# Patient Record
Sex: Female | Born: 1995 | Race: White | Hispanic: No | Marital: Single | Smoking: Never smoker
Health system: Southern US, Community
[De-identification: ages and names within clinical notes are randomized; demographics above are authoritative.]

## PROBLEM LIST (undated history)

## (undated) DIAGNOSIS — F4310 Post-traumatic stress disorder, unspecified: Secondary | ICD-10-CM

## (undated) DIAGNOSIS — F329 Major depressive disorder, single episode, unspecified: Secondary | ICD-10-CM

## (undated) DIAGNOSIS — K469 Unspecified abdominal hernia without obstruction or gangrene: Secondary | ICD-10-CM

## (undated) DIAGNOSIS — F32A Depression, unspecified: Secondary | ICD-10-CM

## (undated) DIAGNOSIS — E282 Polycystic ovarian syndrome: Secondary | ICD-10-CM

## (undated) DIAGNOSIS — F419 Anxiety disorder, unspecified: Secondary | ICD-10-CM

---
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
## 2019-06-08 ENCOUNTER — Ambulatory Visit (INDEPENDENT_AMBULATORY_CARE_PROVIDER_SITE_OTHER): Payer: BC Managed Care – PPO

## 2019-06-08 ENCOUNTER — Other Ambulatory Visit: Payer: Self-pay

## 2019-06-08 ENCOUNTER — Encounter: Payer: Self-pay | Admitting: Emergency Medicine

## 2019-06-08 ENCOUNTER — Ambulatory Visit
Admission: EM | Admit: 2019-06-08 | Discharge: 2019-06-08 | Disposition: A | Discharge status: Home / Self Care | Payer: BC Managed Care – PPO

## 2019-06-08 ENCOUNTER — Ambulatory Visit: Payer: BC Managed Care – PPO

## 2019-06-08 DIAGNOSIS — M25572 Pain in left ankle and joints of left foot: Secondary | ICD-10-CM

## 2019-06-08 DIAGNOSIS — M79672 Pain in left foot: Secondary | ICD-10-CM

## 2019-06-08 HISTORY — DX: Polycystic ovarian syndrome: E28.2

## 2019-06-08 HISTORY — DX: Depression, unspecified: F32.A

## 2019-06-08 HISTORY — DX: Post-traumatic stress disorder, unspecified: F43.10

## 2019-06-08 HISTORY — DX: Anxiety disorder, unspecified: F41.9

## 2019-06-08 NOTE — ED Triage Notes (Signed)
Pt sts left ankle pain after trip and fall today twisting her ankle

## 2019-06-08 NOTE — Discharge Instructions (Signed)
Xray negative for fracture or dislocation. Ibuprofen 800mg  three times a day or naproxen 440mg  twice a day for pain. Ice compress, elevation, ace wrap during activity.  This may take a few weeks to completely resolve, but should be feeling better each week.  Follow-up with orthopedics for further evaluation if symptoms not improving.

## 2019-06-08 NOTE — ED Provider Notes (Signed)
[facility]    CSN: [id number] Arrival date & time: 06/08/19  1739      History   Chief Complaint Chief Complaint  Patient presents with  . Ankle Pain    HPI Kathy Velasquez is a 24 y.o. female.   24 year old female comes in for left ankle/foot pain after injury earlier today.  She inverted her ankle when stepping off an edge.  Was able to ambulate on own after incident, though with pain.  Since then, has had increased pain and swelling, and therefore came in for evaluation.  Pain at rest mostly to the medial and lateral aspect of the ankle.  Decreased range of motion due to pain.     Past Medical History:  Diagnosis Date  . Anxiety   . Depression   . PCOS (polycystic ovarian syndrome)   . PTSD (post-traumatic stress disorder)     There are no problems to display for this patient.   History reviewed. No pertinent surgical history.  OB History   No obstetric history on file.      Home Medications    Prior to Admission medications   Medication Sig Start Date End Date Taking? Authorizing Provider  DULoxetine (CYMBALTA) 30 MG capsule Take 60 mg by mouth daily.   Yes [provider]  lamoTRIgine (LAMICTAL) 25 MG tablet Take 25 mg by mouth 2 (two) times daily.   Yes [provider]  metFORMIN (GLUCOPHAGE) 500 MG tablet Take 500 mg by mouth daily with breakfast.   Yes [provider]  spironolactone (ALDACTONE) 50 MG tablet Take 100 mg by mouth daily.   Yes [provider]    Family History Family History  Problem Relation Age of Onset  . Cancer Mother     Social History Social History   Tobacco Use  . Smoking status: Current Every Day Smoker    Types: E-cigarettes  . Smokeless tobacco: Never Used  Substance Use Topics  . Alcohol use: Not Currently  . Drug use: Never     Allergies   Patient has no known allergies.   Review of Systems Review of Systems  Reason unable to perform ROS: See HPI as above.      Physical Exam Triage Vital Signs ED Triage Vitals  Enc Vitals Group     BP 06/08/19 1753 135/87     Pulse Rate 06/08/19 1753 (!) 108     Resp 06/08/19 1753 18     Temp 06/08/19 1753 97.8 F (36.6 C)     Temp Source 06/08/19 1753 Oral     SpO2 06/08/19 1753 97 %     Weight --      Height --      Head Circumference --      Peak Flow --      Pain Score 06/08/19 1752 6     Pain Loc --      Pain Edu? --      Excl. in [place]? --    No data found.  Updated Vital Signs BP 135/87 (BP Location: Right Arm)   Pulse (!) 108   Temp 97.8 F (36.6 C) (Oral)   Resp 18   SpO2 97%   Physical Exam Constitutional:      General: She is not in acute distress.    Appearance: Normal appearance. She is well-developed. She is not toxic-appearing or diaphoretic.  HENT:     Head: Normocephalic and atraumatic.  Eyes:     Conjunctiva/sclera: Conjunctivae normal.  Pupils: Pupils are equal, round, and reactive to light.  Pulmonary:     Effort: Pulmonary effort is normal. No respiratory distress.     Comments: Speaking in full sentences without difficulty Musculoskeletal:     Cervical back: Normal range of motion and neck supple.     Comments: Swelling to the lateral malleolus.  No contusion.  No tenderness to palpation of proximal tib-fib.  No tenderness to palpation of bilateral malleolus.  Tenderness to palpation of dorsal aspect of proximal medial foot/proximal 1st-2nd MTP. Decreased flexion, internal rotation. NVI  Skin:    General: Skin is warm and dry.  Neurological:     Mental Status: She is alert and oriented to person, place, and time.      UC Treatments / Results  Labs (all labs ordered are listed, but only abnormal results are displayed) Labs Reviewed - No data to display  EKG   Radiology DG Foot Complete Left  Result Date: 06/08/2019 CLINICAL DATA:  Left ankle and foot pain.  Fall. EXAM: LEFT FOOT - COMPLETE 3+ VIEW COMPARISON:  None. FINDINGS: There is no evidence  of fracture or dislocation. There is no evidence of arthropathy or other focal bone abnormality. Soft tissues are unremarkable. IMPRESSION: Negative. Electronically Signed   By: Charlett Nose M.D.   On: 06/08/2019 19:03    Procedures Procedures (including critical care time)  Medications Ordered in UC Medications - No data to display  Initial Impression / Assessment and Plan / UC Course  I have reviewed the triage vital signs and the nursing notes.  Pertinent labs & imaging results that were available during my care of the patient were reviewed by me and considered in my medical decision making (see chart for details).    X-ray reviewed by me without obvious fracture/dislocation.  NSAIDs, ice compress, elevation, Ace wrap during activity.  Expected course of healing discussed.  Return precautions given.  Patient expresses understanding and agrees to plan.  Final Clinical Impressions(s) / UC Diagnoses   Final diagnoses:  Acute left ankle pain   ED Prescriptions    None     PDMP not reviewed this encounter.   Belinda Fisher, PA-C 06/08/19 1941

## 2019-11-18 ENCOUNTER — Encounter (HOSPITAL_COMMUNITY): Payer: Self-pay

## 2019-11-18 ENCOUNTER — Other Ambulatory Visit: Payer: Self-pay

## 2019-11-18 ENCOUNTER — Emergency Department (HOSPITAL_COMMUNITY)
Admission: EM | Admit: 2019-11-18 | Discharge: 2019-11-18 | Disposition: A | Discharge status: Home / Self Care | Payer: BC Managed Care – PPO | Attending: Emergency Medicine | Admitting: Emergency Medicine

## 2019-11-18 ENCOUNTER — Emergency Department (HOSPITAL_COMMUNITY): Payer: BC Managed Care – PPO

## 2019-11-18 DIAGNOSIS — R0789 Other chest pain: Secondary | ICD-10-CM | POA: Insufficient documentation

## 2019-11-18 DIAGNOSIS — F17200 Nicotine dependence, unspecified, uncomplicated: Secondary | ICD-10-CM | POA: Insufficient documentation

## 2019-11-18 LAB — I-STAT BETA HCG BLOOD, ED (MC, WL, AP ONLY): I-stat hCG, quantitative: <5 m[IU]/mL (ref <5)

## 2019-11-18 LAB — BASIC METABOLIC PANEL
Anion gap: 10 (ref 5–15)
BUN: 17 mg/dL (ref 6–20)
CO2: 22 mmol/L (ref 22–32)
Calcium: 8.8 mg/dL — ABNORMAL LOW (ref 8.9–10.3)
Chloride: 103 mmol/L (ref 98–111)
Creatinine, Ser: 0.77 mg/dL (ref 0.44–1.00)
GFR, Estimated: >60 mL/min (ref >60)
Glucose, Bld: 92 mg/dL (ref 70–99)
Potassium: 3.7 mmol/L (ref 3.5–5.1)
Sodium: 135 mmol/L (ref 135–145)

## 2019-11-18 LAB — CBC
HCT: 40.7 % (ref 36.0–46.0)
Hemoglobin: 13.5 g/dL (ref 12.0–15.0)
MCH: 31 pg (ref 26.0–34.0)
MCHC: 33.2 g/dL (ref 30.0–36.0)
MCV: 93.6 fL (ref 80.0–100.0)
Platelets: 279 10*3/uL (ref 150–400)
RBC: 4.35 MIL/uL (ref 3.87–5.11)
RDW: 12.9 % (ref 11.5–15.5)
WBC: 8.8 10*3/uL (ref 4.0–10.5)
nRBC: 0 % (ref 0.0–0.2)

## 2019-11-18 LAB — TROPONIN I (HIGH SENSITIVITY): Troponin I (High Sensitivity): 3 ng/L (ref <18)

## 2019-11-18 NOTE — Discharge Instructions (Addendum)
You have been evaluated for your chest discomfort.  Fortunately your chest x-ray, EKG, and labs are reassuring.  No signs of life-threatening emergency.  Follow-up closely with your primary care doctor for further care.

## 2019-11-18 NOTE — ED Notes (Signed)
Patient transported to X-ray 

## 2019-11-18 NOTE — ED Provider Notes (Signed)
[facility] EMERGENCY DEPARTMENT Provider Note   CSN: [id number] Arrival date & time: 11/18/19  4166     History Chief Complaint  Patient presents with  . Chest Pain    Kathy Velasquez is a 24 y.o. female.  The history is provided by the patient and medical records. No language interpreter was used.  Chest Pain    24 year old female significant history of anxiety, depression, PCOS, presenting for evaluation of chest pain. Patient states she experienced an pinching sensation to her left shoulder/axillary region this morning that spreads towards her upper chest. At that moment she felt a bit dizzy and was concerned. She reached out and in recall an on-call nurse who said patient should come to the ED. She mentions she has been experiencing this pinching sensation in the same region for several years. She did also mention that she has been evaluated in the ED as well as a PCP but states no one takes it seriously. She denies any prior injury to the affected area, she denies any fever, chills, cough, exertional chest pain, or rash. She has had ultrasound to assess for potential tumor and it was negative. She has not expressing any lumps or bumps in the affected area. She denies any specific treatment tried. At this time she denies having any significant pain. No prior history of PE or DVT.  Past Medical History:  Diagnosis Date  . Anxiety   . Depression   . PCOS (polycystic ovarian syndrome)   . PTSD (post-traumatic stress disorder)     There are no problems to display for this patient.   History reviewed. No pertinent surgical history.   OB History   No obstetric history on file.     Family History  Problem Relation Age of Onset  . Cancer Mother     Social History   Tobacco Use  . Smoking status: Current Every Day Smoker  . Smokeless tobacco: Current User  Vaping Use  . Vaping Use: Every day  Substance Use Topics  . Alcohol use: Yes  . Drug use:  Never    Home Medications Prior to Admission medications   Not on File    Allergies    Patient has no known allergies.  Review of Systems   Review of Systems  Cardiovascular: Positive for chest pain.  All other systems reviewed and are negative.   Physical Exam Updated Vital Signs BP 121/82   Pulse 86   Temp 98.9 F (37.2 C)   Resp 16   Ht 5\' 5"  (1.651 m)   Wt 106.6 kg   LMP  (LMP Unknown)   SpO2 99%   BMI 39.11 kg/m   Physical Exam Vitals and nursing note reviewed.  Constitutional:      General: She is not in acute distress.    Appearance: She is well-developed. She is obese.  HENT:     Head: Atraumatic.  Eyes:     Conjunctiva/sclera: Conjunctivae normal.  Cardiovascular:     Rate and Rhythm: Normal rate and regular rhythm.     Heart sounds: Normal heart sounds.  Pulmonary:     Effort: Pulmonary effort is normal.     Breath sounds: Normal breath sounds. No wheezing, rhonchi or rales.     Comments: Examinations of the left chest wall, left axillary region and left shoulder are unremarkable. No overlying skin changes, no lymphadenopathy, no nodules or deformity noted. Left shoulder with full range of motion. Radial pulse 2+. Chest:  Chest wall: No tenderness.  Abdominal:     Palpations: Abdomen is soft.     Tenderness: There is no abdominal tenderness.  Musculoskeletal:     Cervical back: Neck supple.  Skin:    Findings: No rash.  Neurological:     Mental Status: She is alert.     ED Results / Procedures / Treatments   Labs (all labs ordered are listed, but only abnormal results are displayed) Labs Reviewed  BASIC METABOLIC PANEL - Abnormal; Notable for the following components:      Result Value   Calcium 8.8 (*)    All other components within normal limits  CBC  I-STAT BETA HCG BLOOD, ED ([facility], [facility], [facility] ONLY)  TROPONIN I (HIGH SENSITIVITY)    EKG None   Date: 11/18/2019  Rate: 87  Rhythm: normal sinus rhythm  QRS Axis: normal  Intervals:  normal  ST/T Wave abnormalities: normal  Conduction Disutrbances: none  Narrative Interpretation:   Old EKG Reviewed: No significant changes noted EKG reviewed and interpreted by me    Radiology DG Chest 2 View  Result Date: 11/18/2019 CLINICAL DATA:  Chest pain EXAM: CHEST - 2 VIEW COMPARISON:  None. FINDINGS: Heart size and mediastinal contours are within normal limits. Lungs are clear. No pleural effusion or pneumothorax is seen. Osseous structures about the chest are unremarkable. IMPRESSION: Normal chest x-ray. Electronically Signed   By: Bary Richard M.D.   On: 11/18/2019 07:15    Procedures Procedures (including critical care time)  Medications Ordered in ED Medications - No data to display  ED Course  I have reviewed the triage vital signs and the nursing notes.  Pertinent labs & imaging results that were available during my care of the patient were reviewed by me and considered in my medical decision making (see chart for details).    MDM Rules/Calculators/A&P                          BP 121/82   Pulse 86   Temp 98.9 F (37.2 C)   Resp 16   Ht 5\' 5"  (1.651 m)   Wt 106.6 kg   LMP  (LMP Unknown)   SpO2 99%   BMI 39.11 kg/m   Final Clinical Impression(s) / ED Diagnoses Final diagnoses:  Atypical chest pain    Rx / DC Orders ED Discharge Orders    None     7:20 AM Patient report recurrent pinching sensation to her left shoulder/axillary region for several years worse this morning. States she has been seen evaluate multiple times for this complaint but no definitive diagnosis was given.No obvious signs of skin infection, She does not have any significant risk factor for ACS or PE. No signs of trauma. Examination is essentially unremarkable.  Her EKG is normal, pregnancy test is negative, normal WBC, normal H&H, normal chest x-ray, normal troponin.  Heart score of 1, low risk of Mace.  Patient is stable for discharge.  Outpatient follow-up recommended.     , PA-C 11/18/19 11/20/19    [id number], MD 11/18/19 [phone]

## 2019-11-18 NOTE — ED Triage Notes (Signed)
Patient came in via POV with c/o central chest pain that feels like "pinching"; Pt states she has hx of same but the "pitching" feeling has spread; Pt states no pain on arrival but has gotten to a 4/10 and has caused her to be concerned; pt c/o some SOB with dizziness and symptoms started a few hours ago. pt is a&ox 4 and has no acute distress; pt ambulated to treatment room from the waiting room;-Monique,RN

## 2019-12-05 ENCOUNTER — Ambulatory Visit
Admission: EM | Admit: 2019-12-05 | Discharge: 2019-12-05 | Disposition: A | Discharge status: Home / Self Care | Payer: Self-pay | Attending: Physician Assistant | Admitting: Physician Assistant

## 2019-12-05 ENCOUNTER — Other Ambulatory Visit: Payer: Self-pay

## 2019-12-05 DIAGNOSIS — R0789 Other chest pain: Secondary | ICD-10-CM

## 2019-12-05 MED ORDER — HYDROXYZINE HCL 25 MG PO TABS
25.0000 mg | ORAL_TABLET | Freq: Four times a day (QID) | ORAL | 0 refills | Status: DC | PRN
Start: 2019-12-05 — End: 2020-07-15

## 2019-12-05 NOTE — ED Triage Notes (Signed)
Pt stated she was having dizziness, lightheadedness, and also left side chest pain radiating down her left arm. The pain seems to be intermittent as well as move periodically. Pt also has a diagnoses of anxiety. Pt had to stop her duloxetine and lamotrigine suddenly due to a loss of benefits and has been unable to treat her anxiety issues. Pt is aox4 and ambulatory.

## 2019-12-05 NOTE — Discharge Instructions (Signed)
No alarming signs on exam.  You can trial hydroxyzine for anxiety if needed.  Follow-up with PCP for further evaluation.

## 2019-12-05 NOTE — ED Provider Notes (Signed)
[facility]    CSN: [id number] Arrival date & time: 12/05/19  1901      History   Chief Complaint Chief Complaint  Patient presents with  . Chest Pain    left side and under ribcage on left    HPI Kathy Velasquez is a 24 y.o. female.   24 year old female comes in for left sided chest pain radiating to left arm. States has had "pinching" to the left chest causing pain that is intermittent for the past few years. However, recently, this pain can now radiate down the left arm, and causing pressure to the head with dizziness/lightheadedness. Patient went to the ED 2 weeks ago for the same with negative workup. She denies any aggravating or alleviating factor. Denies URI symptoms, fever. Denies syncope. She has perceived left sided weakness along with the pain. Denies slurred speech, facial drooping. She states she has history of anxiety, and unsure if this is causing the new symptoms. She had stopped duloxetine and lamotrigine few months ago due to loss of insurance. Denies HI/SI.     Past Medical History:  Diagnosis Date  . Anxiety   . Depression   . PCOS (polycystic ovarian syndrome)   . PTSD (post-traumatic stress disorder)     There are no problems to display for this patient.   History reviewed. No pertinent surgical history.  OB History   No obstetric history on file.      Home Medications    Prior to Admission medications   Medication Sig Start Date End Date Taking? Authorizing Provider  hydrOXYzine (ATARAX/VISTARIL) 25 MG tablet Take 1 tablet (25 mg total) by mouth every 6 (six) hours as needed for anxiety. 12/05/19   Belinda Fisher, PA-C    Family History Family History  Problem Relation Age of Onset  . Cancer Mother     Social History Social History   Tobacco Use  . Smoking status: Current Every Day Smoker  . Smokeless tobacco: Current User  Vaping Use  . Vaping Use: Every day  Substance Use Topics  . Alcohol use: Yes  . Drug use: Never      Allergies   Patient has no known allergies.   Review of Systems Review of Systems  Reason unable to perform ROS: See HPI as above.     Physical Exam Triage Vital Signs ED Triage Vitals  Enc Vitals Group     BP 12/05/19 1916 122/84     Pulse Rate 12/05/19 1916 88     Resp 12/05/19 1916 20     Temp 12/05/19 1916 97.7 F (36.5 C)     Temp Source 12/05/19 1916 Oral     SpO2 12/05/19 1916 97 %     Weight --      Height --      Head Circumference --      Peak Flow --      Pain Score 12/05/19 1945 4     Pain Loc --      Pain Edu? --      Excl. in [place]? --    No data found.  Updated Vital Signs BP 122/84 (BP Location: Left Arm)   Pulse 88   Temp 97.7 F (36.5 C) (Oral)   Resp 20   LMP  (LMP Unknown)   SpO2 97%   Physical Exam Constitutional:      General: She is not in acute distress.    Appearance: Normal appearance. She is not ill-appearing, toxic-appearing  or diaphoretic.  HENT:     Head: Normocephalic and atraumatic.     Mouth/Throat:     Mouth: Mucous membranes are moist.     Pharynx: Oropharynx is clear. Uvula midline.  Eyes:     Extraocular Movements: Extraocular movements intact.     Conjunctiva/sclera: Conjunctivae normal.     Pupils: Pupils are equal, round, and reactive to light.  Cardiovascular:     Rate and Rhythm: Normal rate and regular rhythm.     Heart sounds: Normal heart sounds. No murmur heard.  No friction rub. No gallop.   Pulmonary:     Effort: Pulmonary effort is normal. No accessory muscle usage, prolonged expiration, respiratory distress or retractions.     Comments: Lungs clear to auscultation without adventitious lung sounds. Chest:     Chest wall: No tenderness.  Musculoskeletal:     Cervical back: Normal range of motion and neck supple.     Comments: Full ROM of BUE/BLE. Strength/sensation 5/5.  Skin:    General: Skin is warm and dry.  Neurological:     General: No focal deficit present.     Mental Status: She is alert  and oriented to person, place, and time.     Comments: Cranial nerves II-XII grossly intact. Strength 5/5 bilaterally for upper and lower extremity. Sensation intact. Normal coordination with normal finger to nose, heel to shin. Negative pronator drift, romberg. Gait intact. Able to ambulate on own without difficulty.       UC Treatments / Results  Labs (all labs ordered are listed, but only abnormal results are displayed) Labs Reviewed - No data to display  EKG   Radiology No results found.  Procedures Procedures (including critical care time)  Medications Ordered in UC Medications - No data to display  Initial Impression / Assessment and Plan / UC Course  I have reviewed the triage vital signs and the nursing notes.  Pertinent labs & imaging results that were available during my care of the patient were reviewed by me and considered in my medical decision making (see chart for details).    Patient with workup at the ED with negative trop, CXR. Patient with perceived left arm weakness, on exam strength 5/5 BUE/BLE. Neurology exam grossly intact without focal deficits. Will trial hydroxyzine for anxiety and have patient follow up with PCP for further evaluation of atypical chest pain. Resources provided. Return precautions given.  Final Clinical Impressions(s) / UC Diagnoses   Final diagnoses:  Atypical chest pain    ED Prescriptions    Medication Sig Dispense Auth. Provider   hydrOXYzine (ATARAX/VISTARIL) 25 MG tablet Take 1 tablet (25 mg total) by mouth every 6 (six) hours as needed for anxiety. 12 tablet Belinda Fisher, PA-C     PDMP not reviewed this encounter.   Belinda Fisher, PA-C 12/10/19 [phone]

## 2020-04-10 ENCOUNTER — Other Ambulatory Visit: Payer: Self-pay

## 2020-04-10 ENCOUNTER — Encounter (HOSPITAL_COMMUNITY): Payer: Self-pay | Admitting: Emergency Medicine

## 2020-04-10 ENCOUNTER — Ambulatory Visit (HOSPITAL_COMMUNITY)
Admission: EM | Admit: 2020-04-10 | Discharge: 2020-04-10 | Disposition: A | Discharge status: Home / Self Care | Payer: Commercial Managed Care - PPO | Attending: Student | Admitting: Student

## 2020-04-10 DIAGNOSIS — S29012A Strain of muscle and tendon of back wall of thorax, initial encounter: Secondary | ICD-10-CM | POA: Diagnosis not present

## 2020-04-10 MED ORDER — TIZANIDINE HCL 2 MG PO CAPS: 2.0000 mg | ORAL_CAPSULE | Freq: Three times a day (TID) | ORAL | 0 refills | Status: DC

## 2020-04-10 MED ORDER — PREDNISONE 20 MG PO TABS
40.0000 mg | ORAL_TABLET | Freq: Every day | ORAL | 0 refills | Status: AC
Start: 2020-04-10 — End: 2020-04-15

## 2020-04-10 MED ORDER — PREDNISONE 20 MG PO TABS
40.0000 mg | ORAL_TABLET | Freq: Every day | ORAL | 0 refills | Status: DC
Start: 2020-04-10 — End: 2020-04-10

## 2020-04-10 MED ORDER — TIZANIDINE HCL 2 MG PO CAPS
2.0000 mg | ORAL_CAPSULE | Freq: Three times a day (TID) | ORAL | 0 refills | Status: DC
Start: 2020-04-10 — End: 2020-07-12

## 2020-04-10 NOTE — Discharge Instructions (Addendum)
-  Start the new muscle relaxer-Zanaflex (tizanidine).  You can take this up to 3 times daily for muscular strain and pain.  This can make you drowsy, so do not take before driving or operating machinery.  Try to avoid taking this within 24 hours of driving. -I also sent a steroid-prednisone (Deltasone), 2 pills taken together in the morning.  This can give you energy, so take in the morning. -You can also do ibuprofen and Tylenol.  You can take ibuprofen up to 800mg  3 times daily with food, and Tylenol 1000 mg 3 times daily. -Try heating pad, warm baths, range of motion exercises. -Seek additional medical attention if you develop new symptoms like numbness in your legs, changes in bowel or bladder function, chest pain, shortness of breath.

## 2020-04-10 NOTE — ED Triage Notes (Signed)
Pt states that she is having back pain that started Sunday morning. Pt states that it is very difficult to move even to lift her hands/ arms up without a pain shooting through her back . Pt states the pain in centralized in the middle of her back.

## 2020-04-10 NOTE — ED Provider Notes (Signed)
[facility]    CSN: [id number] Arrival date & time: 04/10/20  1436      History   Chief Complaint Chief Complaint  Patient presents with  . Back Pain    HPI Kathy Velasquez is a 25 y.o. female presenting with back pain x4 days.  History anxiety, depression, PCOS, PTSD.  Notes centralized back pain for 4 days.  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  History of some low back issues over the years, but this time she does not have any muscle relaxers on hand.  Did take 2 tabs of Flexeril that she had at home with improvement in back pain, but she ran out of this yesterday.  Notes improvement with heating pad as well. Denies urinary symptoms. Denies pain shooting down legs, denies numbness in arms/legs, denies weakness in arms/legs, denies saddle anesthesia, denies bowel/bladder incontinence.    HPI  Past Medical History:  Diagnosis Date  . Anxiety   . Depression   . PCOS (polycystic ovarian syndrome)   . PTSD (post-traumatic stress disorder)     There are no problems to display for this patient.   History reviewed. No pertinent surgical history.  OB History   No obstetric history on file.      Home Medications    Prior to Admission medications   Medication Sig Start Date End Date Taking? Authorizing Provider  predniSONE (DELTASONE) 20 MG tablet Take 2 tablets (40 mg total) by mouth daily for 5 days. 04/10/20 04/15/20 Yes Rhys Martini, PA-C  tizanidine (ZANAFLEX) 2 MG capsule Take 1 capsule (2 mg total) by mouth 3 (three) times daily. 04/10/20  Yes Rhys Martini, PA-C  hydrOXYzine (ATARAX/VISTARIL) 25 MG tablet Take 1 tablet (25 mg total) by mouth every 6 (six) hours as needed for anxiety. 12/05/19   Belinda Fisher, PA-C    Family History Family History  Problem Relation Age of Onset  . Cancer Mother     Social History Social History   Tobacco Use  . Smoking status: Current Every Day Smoker  . Smokeless  tobacco: Current User  Vaping Use  . Vaping Use: Every day  Substance Use Topics  . Alcohol use: Yes  . Drug use: Never     Allergies   Patient has no known allergies.   Review of Systems Review of Systems  Constitutional: Negative for chills, fever and unexpected weight change.  Respiratory: Negative for chest tightness and shortness of breath.   Cardiovascular: Negative for chest pain and palpitations.  Gastrointestinal: Negative for abdominal pain, diarrhea, nausea and vomiting.  Genitourinary: Negative for decreased urine volume, difficulty urinating and frequency.  Musculoskeletal: Positive for back pain. Negative for arthralgias, gait problem, joint swelling, myalgias, neck pain and neck stiffness.  Skin: Negative for wound.  Neurological: Negative for dizziness, tremors, seizures, syncope, facial asymmetry, speech difficulty, weakness, light-headedness, numbness and headaches.  All other systems reviewed and are negative.    Physical Exam Triage Vital Signs ED Triage Vitals  Enc Vitals Group     BP 04/10/20 1531 111/86     Pulse Rate 04/10/20 1531 80     Resp 04/10/20 1531 18     Temp 04/10/20 1531 97.9 F (36.6 C)     Temp Source 04/10/20 1531 Temporal     SpO2 04/10/20 1531 97 %     Weight --      Height --  Head Circumference --      Peak Flow --      Pain Score 04/10/20 1530 5     Pain Loc --      Pain Edu? --      Excl. in GC? --    No data found.  Updated Vital Signs BP 111/86 (BP Location: Right Arm)   Pulse 80   Temp 97.9 F (36.6 C) (Temporal)   Resp 18   SpO2 97%   Visual Acuity Right Eye Distance:   Left Eye Distance:   Bilateral Distance:    Right Eye Near:   Left Eye Near:    Bilateral Near:     Physical Exam Vitals reviewed.  Constitutional:      General: She is not in acute distress.    Appearance: Normal appearance. She is not ill-appearing.  HENT:     Head: Normocephalic and atraumatic.  Eyes:     Extraocular  Movements: Extraocular movements intact.     Pupils: Pupils are equal, round, and reactive to light.  Cardiovascular:     Rate and Rhythm: Normal rate and regular rhythm.     Heart sounds: Normal heart sounds.  Pulmonary:     Effort: Pulmonary effort is normal.     Breath sounds: Normal breath sounds and air entry.  Abdominal:     Tenderness: There is no abdominal tenderness. There is no right CVA tenderness, left CVA tenderness, guarding or rebound.     Comments: No bowel or bladder incontinence.  Musculoskeletal:     Cervical back: Normal range of motion. No swelling, deformity, signs of trauma, rigidity, spasms, tenderness, bony tenderness or crepitus. No pain with movement.     Thoracic back: Spasms and tenderness present. No swelling, deformity, signs of trauma or bony tenderness. Normal range of motion. No scoliosis.     Lumbar back: No swelling, deformity, signs of trauma, spasms, tenderness or bony tenderness. Normal range of motion. Negative right straight leg raise test and negative left straight leg raise test. No scoliosis.     Comments: Strength 5/5 in UEs and LEs. Bilateral thoracic paraspinous muscle tenderness to deep palpation, left worse than right.  Pain elicited with flexion of the thoracic spine. No spinous deformity or tenderness, no step-off.  Neurological:     General: No focal deficit present.     Mental Status: She is alert and oriented to person, place, and time.     Cranial Nerves: No cranial nerve deficit.     Comments: Strength 5/5 in UEs and LEs. Gait normal. Sensation intact in UEs and LEs.   Psychiatric:        Mood and Affect: Mood normal.        Behavior: Behavior normal.        Thought Content: Thought content normal.        Judgment: Judgment normal.      UC Treatments / Results  Labs (all labs ordered are listed, but only abnormal results are displayed) Labs Reviewed - No data to display  EKG   Radiology No results  found.  Procedures Procedures (including critical care time)  Medications Ordered in UC Medications - No data to display  Initial Impression / Assessment and Plan / UC Course  I have reviewed the triage vital signs and the nursing notes.  Pertinent labs & imaging results that were available during my care of the patient were reviewed by me and considered in my medical decision making (see chart for details).  This patient is a 25 year old female presenting with thoracic strain.  No red flag symptoms.  Zanaflex and prednisone as below.  She is not a diabetic.  Also rec Tylenol/ibuprofen.  Red flag symptoms discussed return precautions discussed.  This chart was dictated using voice recognition software, Dragon. Despite the best efforts of this provider to proofread and correct errors, errors may still occur which can change documentation meaning.   Final Clinical Impressions(s) / UC Diagnoses   Final diagnoses:  Strain of thoracic back region     Discharge Instructions     -Start the new muscle relaxer-Zanaflex (tizanidine).  You can take this up to 3 times daily for muscular strain and pain.  This can make you drowsy, so do not take before driving or operating machinery.  Try to avoid taking this within 24 hours of driving. -I also sent a steroid-prednisone (Deltasone), 2 pills taken together in the morning.  This can give you energy, so take in the morning. -You can also do ibuprofen and Tylenol.  You can take ibuprofen up to 800mg  3 times daily with food, and Tylenol 1000 mg 3 times daily. -Try heating pad, warm baths, range of motion exercises. -Seek additional medical attention if you develop new symptoms like numbness in your legs, changes in bowel or bladder function, chest pain, shortness of breath.    ED Prescriptions    Medication Sig Dispense Auth. Provider   tizanidine (ZANAFLEX) 2 MG capsule  (Status: Discontinued) Take 1 capsule (2 mg total) by mouth 3  (three) times daily. 21 capsule , PA-C   predniSONE (DELTASONE) 20 MG tablet  (Status: Discontinued) Take 2 tablets (40 mg total) by mouth daily for 5 days. 10 tablet Rhys Martini, PA-C   tizanidine (ZANAFLEX) 2 MG capsule Take 1 capsule (2 mg total) by mouth 3 (three) times daily. 21 capsule Rhys Martini, PA-C   predniSONE (DELTASONE) 20 MG tablet Take 2 tablets (40 mg total) by mouth daily for 5 days. 10 tablet Rhys Martini, PA-C     PDMP not reviewed this encounter.   Rhys Martini, PA-C 04/10/20 1709

## 2020-05-16 ENCOUNTER — Encounter (HOSPITAL_COMMUNITY): Payer: Self-pay | Admitting: *Deleted

## 2020-05-16 ENCOUNTER — Other Ambulatory Visit: Payer: Self-pay

## 2020-05-16 ENCOUNTER — Emergency Department (HOSPITAL_COMMUNITY): Payer: Commercial Managed Care - PPO

## 2020-05-16 ENCOUNTER — Emergency Department (HOSPITAL_COMMUNITY)
Admission: EM | Admit: 2020-05-16 | Discharge: 2020-05-16 | Disposition: A | Discharge status: Home / Self Care | Payer: Commercial Managed Care - PPO | Attending: Emergency Medicine | Admitting: Emergency Medicine

## 2020-05-16 DIAGNOSIS — R1033 Periumbilical pain: Secondary | ICD-10-CM

## 2020-05-16 DIAGNOSIS — R630 Anorexia: Secondary | ICD-10-CM | POA: Insufficient documentation

## 2020-05-16 DIAGNOSIS — K429 Umbilical hernia without obstruction or gangrene: Secondary | ICD-10-CM | POA: Insufficient documentation

## 2020-05-16 DIAGNOSIS — R030 Elevated blood-pressure reading, without diagnosis of hypertension: Secondary | ICD-10-CM | POA: Diagnosis not present

## 2020-05-16 HISTORY — DX: Unspecified abdominal hernia without obstruction or gangrene: K46.9

## 2020-05-16 LAB — URINALYSIS, ROUTINE W REFLEX MICROSCOPIC
Bilirubin Urine: NEGATIVE
Glucose, UA: NEGATIVE mg/dL
Hgb urine dipstick: NEGATIVE
Ketones, ur: NEGATIVE mg/dL
Leukocytes,Ua: NEGATIVE
Nitrite: NEGATIVE
Protein, ur: NEGATIVE mg/dL
Specific Gravity, Urine: 1.018 (ref 1.005–1.030)
pH: 5 (ref 5.0–8.0)

## 2020-05-16 LAB — CBC
HCT: 41.8 % (ref 36.0–46.0)
Hemoglobin: 14.2 g/dL (ref 12.0–15.0)
MCH: 31.3 pg (ref 26.0–34.0)
MCHC: 34 g/dL (ref 30.0–36.0)
MCV: 92.3 fL (ref 80.0–100.0)
Platelets: 280 10*3/uL (ref 150–400)
RBC: 4.53 MIL/uL (ref 3.87–5.11)
RDW: 13.2 % (ref 11.5–15.5)
WBC: 8.7 10*3/uL (ref 4.0–10.5)
nRBC: 0 % (ref 0.0–0.2)

## 2020-05-16 LAB — COMPREHENSIVE METABOLIC PANEL
ALT: 44 U/L (ref 0–44)
AST: 20 U/L (ref 15–41)
Albumin: 4.1 g/dL (ref 3.5–5.0)
Alkaline Phosphatase: 75 U/L (ref 38–126)
Anion gap: 12 (ref 5–15)
BUN: 14 mg/dL (ref 6–20)
CO2: 22 mmol/L (ref 22–32)
Calcium: 9.1 mg/dL (ref 8.9–10.3)
Chloride: 102 mmol/L (ref 98–111)
Creatinine, Ser: 0.73 mg/dL (ref 0.44–1.00)
GFR, Estimated: >60 mL/min (ref >60)
Glucose, Bld: 97 mg/dL (ref 70–99)
Potassium: 3.9 mmol/L (ref 3.5–5.1)
Sodium: 136 mmol/L (ref 135–145)
Total Bilirubin: 0.6 mg/dL (ref 0.3–1.2)
Total Protein: 7.8 g/dL (ref 6.5–8.1)

## 2020-05-16 LAB — LIPASE, BLOOD: Lipase: 25 U/L (ref 11–51)

## 2020-05-16 LAB — I-STAT BETA HCG BLOOD, ED (MC, WL, AP ONLY): I-stat hCG, quantitative: <5 m[IU]/mL (ref <5)

## 2020-05-16 MED ORDER — ACETAMINOPHEN 500 MG PO TABS
1000.0000 mg | ORAL_TABLET | Freq: Once | ORAL | Status: AC
  Administered 2020-05-16: 1000 mg via ORAL
  Filled 2020-05-16: qty 2

## 2020-05-16 NOTE — ED Notes (Signed)
Patient transported to CT 
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
## 2020-05-16 NOTE — ED Triage Notes (Signed)
Pt has abd pain with increased upper gastric pressure, has not been able to eat today and has difficulty  passing gas today. Abd hernia present in umbilical  Area.

## 2020-05-16 NOTE — Discharge Instructions (Addendum)
Your laboratory results are within normal limits today.  We discussed CT results at length.  Were given the number to [facility], please schedule follow-up for further management of your periumbilical hernia.

## 2020-07-01 DIAGNOSIS — K429 Umbilical hernia without obstruction or gangrene: Secondary | ICD-10-CM

## 2020-07-01 HISTORY — DX: Umbilical hernia without obstruction or gangrene: K42.9

## 2020-07-09 DIAGNOSIS — E282 Polycystic ovarian syndrome: Secondary | ICD-10-CM | POA: Insufficient documentation

## 2020-07-09 DIAGNOSIS — F419 Anxiety disorder, unspecified: Secondary | ICD-10-CM | POA: Insufficient documentation

## 2020-07-11 ENCOUNTER — Encounter (HOSPITAL_COMMUNITY): Payer: Self-pay | Admitting: Emergency Medicine

## 2020-07-11 ENCOUNTER — Emergency Department (HOSPITAL_COMMUNITY): Payer: Commercial Managed Care - PPO

## 2020-07-11 ENCOUNTER — Emergency Department (HOSPITAL_COMMUNITY)
Admission: EM | Admit: 2020-07-11 | Discharge: 2020-07-12 | Disposition: A | Discharge status: Home / Self Care | Payer: Commercial Managed Care - PPO | Source: Home / Self Care

## 2020-07-11 DIAGNOSIS — R0789 Other chest pain: Secondary | ICD-10-CM | POA: Insufficient documentation

## 2020-07-11 DIAGNOSIS — Z20822 Contact with and (suspected) exposure to covid-19: Secondary | ICD-10-CM | POA: Diagnosis not present

## 2020-07-11 DIAGNOSIS — F17290 Nicotine dependence, other tobacco product, uncomplicated: Secondary | ICD-10-CM | POA: Diagnosis not present

## 2020-07-11 DIAGNOSIS — R778 Other specified abnormalities of plasma proteins: Secondary | ICD-10-CM | POA: Diagnosis not present

## 2020-07-11 DIAGNOSIS — R110 Nausea: Secondary | ICD-10-CM | POA: Insufficient documentation

## 2020-07-11 DIAGNOSIS — Z5321 Procedure and treatment not carried out due to patient leaving prior to being seen by health care provider: Secondary | ICD-10-CM | POA: Insufficient documentation

## 2020-07-11 LAB — CBC WITH DIFFERENTIAL/PLATELET
Abs Immature Granulocytes: 0.01 10*3/uL (ref 0.00–0.07)
Basophils Absolute: 0 10*3/uL (ref 0.0–0.1)
Basophils Relative: 1 %
Eosinophils Absolute: 0.2 10*3/uL (ref 0.0–0.5)
Eosinophils Relative: 2 %
HCT: 42.3 % (ref 36.0–46.0)
Hemoglobin: 14.3 g/dL (ref 12.0–15.0)
Immature Granulocytes: 0 %
Lymphocytes Relative: 24 %
Lymphs Abs: 1.5 10*3/uL (ref 0.7–4.0)
MCH: 31.4 pg (ref 26.0–34.0)
MCHC: 33.8 g/dL (ref 30.0–36.0)
MCV: 93 fL (ref 80.0–100.0)
Monocytes Absolute: 0.4 10*3/uL (ref 0.1–1.0)
Monocytes Relative: 6 %
Neutro Abs: 4.2 10*3/uL (ref 1.7–7.7)
Neutrophils Relative %: 67 %
Platelets: 275 10*3/uL (ref 150–400)
RBC: 4.55 MIL/uL (ref 3.87–5.11)
RDW: 12.8 % (ref 11.5–15.5)
WBC: 6.3 10*3/uL (ref 4.0–10.5)
nRBC: 0 % (ref 0.0–0.2)

## 2020-07-11 LAB — COMPREHENSIVE METABOLIC PANEL
ALT: 38 U/L (ref 0–44)
AST: 20 U/L (ref 15–41)
Albumin: 4 g/dL (ref 3.5–5.0)
Alkaline Phosphatase: 76 U/L (ref 38–126)
Anion gap: 9 (ref 5–15)
BUN: 15 mg/dL (ref 6–20)
CO2: 23 mmol/L (ref 22–32)
Calcium: 9.3 mg/dL (ref 8.9–10.3)
Chloride: 103 mmol/L (ref 98–111)
Creatinine, Ser: 0.78 mg/dL (ref 0.44–1.00)
GFR, Estimated: >60 mL/min (ref >60)
Glucose, Bld: 99 mg/dL (ref 70–99)
Potassium: 4 mmol/L (ref 3.5–5.1)
Sodium: 135 mmol/L (ref 135–145)
Total Bilirubin: 0.9 mg/dL (ref 0.3–1.2)
Total Protein: 7.5 g/dL (ref 6.5–8.1)

## 2020-07-11 LAB — I-STAT BETA HCG BLOOD, ED (MC, WL, AP ONLY): I-stat hCG, quantitative: <5 m[IU]/mL (ref <5)

## 2020-07-11 LAB — TROPONIN I (HIGH SENSITIVITY): Troponin I (High Sensitivity): 25 ng/L — ABNORMAL HIGH (ref <18)

## 2020-07-11 LAB — LIPASE, BLOOD: Lipase: 24 U/L (ref 11–51)

## 2020-07-11 NOTE — ED Notes (Signed)
Called again no answer  

## 2020-07-11 NOTE — ED Provider Notes (Signed)
Emergency Medicine Provider Triage Evaluation Note  Kathy Velasquez , a 25 y.o. female  was evaluated in triage.  Pt complains of feeling a sense of dread and inability to sleep.  She sates she is used to panic attacks.  She reports tat 6am her chest felt like it was burning and started radiating out and radiating up into her jaw.  She hasn't eaten but felt nauseated.   No leg swelling, no surgeries  Review of Systems  Positive: Chest pain Negative: fever  Physical Exam  BP 133/69 (BP Location: Right Arm)   Pulse 89   Temp 98.6 F (37 C) (Oral)   Resp 18   SpO2 99%  Gen:   Awake, no distress  Resp:  Normal effort  MSK:   Moves extremities without difficulty  Other:  Speech is not slurred.    Medical Decision Making  Medically screening exam initiated at 1:05 PM.  Appropriate orders placed.  Kathy Velasquez was informed that the remainder of the evaluation will be completed by another provider, this initial triage assessment does not replace that evaluation, and the importance of remaining in the ED until their evaluation is complete.     Cristina Gong, PA-C 07/11/20 1314    Rolan Bucco, MD 07/11/20 [phone]

## 2020-07-11 NOTE — ED Triage Notes (Signed)
Pt here from home with c/o anxiety and insomnia along with some cp , pt will be starting anxiety med today

## 2020-07-11 NOTE — ED Notes (Signed)
Called no answer

## 2020-07-11 NOTE — ED Notes (Signed)
Called for vitals no answer °

## 2020-07-12 ENCOUNTER — Other Ambulatory Visit: Payer: Self-pay

## 2020-07-12 ENCOUNTER — Emergency Department (HOSPITAL_COMMUNITY): Payer: Commercial Managed Care - PPO

## 2020-07-12 ENCOUNTER — Observation Stay (HOSPITAL_COMMUNITY)
Admission: EM | Admit: 2020-07-12 | Discharge: 2020-07-15 | Disposition: A | Discharge status: Home / Self Care | Payer: Commercial Managed Care - PPO | Attending: Internal Medicine | Admitting: Internal Medicine

## 2020-07-12 ENCOUNTER — Encounter (HOSPITAL_COMMUNITY): Payer: Self-pay

## 2020-07-12 DIAGNOSIS — R079 Chest pain, unspecified: Secondary | ICD-10-CM | POA: Diagnosis present

## 2020-07-12 DIAGNOSIS — F17290 Nicotine dependence, other tobacco product, uncomplicated: Secondary | ICD-10-CM | POA: Diagnosis present

## 2020-07-12 DIAGNOSIS — F411 Generalized anxiety disorder: Secondary | ICD-10-CM | POA: Diagnosis present

## 2020-07-12 DIAGNOSIS — K219 Gastro-esophageal reflux disease without esophagitis: Secondary | ICD-10-CM | POA: Diagnosis present

## 2020-07-12 DIAGNOSIS — Z20822 Contact with and (suspected) exposure to covid-19: Secondary | ICD-10-CM | POA: Insufficient documentation

## 2020-07-12 DIAGNOSIS — R0789 Other chest pain: Principal | ICD-10-CM | POA: Insufficient documentation

## 2020-07-12 DIAGNOSIS — R778 Other specified abnormalities of plasma proteins: Secondary | ICD-10-CM

## 2020-07-12 LAB — TSH: TSH: 1.062 u[IU]/mL (ref 0.350–4.500)

## 2020-07-12 LAB — CBC WITH DIFFERENTIAL/PLATELET
Abs Immature Granulocytes: 0.01 10*3/uL (ref 0.00–0.07)
Basophils Absolute: 0 10*3/uL (ref 0.0–0.1)
Basophils Relative: 0 %
Eosinophils Absolute: 0.2 10*3/uL (ref 0.0–0.5)
Eosinophils Relative: 3 %
HCT: 42.5 % (ref 36.0–46.0)
Hemoglobin: 14.3 g/dL (ref 12.0–15.0)
Immature Granulocytes: 0 %
Lymphocytes Relative: 26 %
Lymphs Abs: 1.6 10*3/uL (ref 0.7–4.0)
MCH: 31.8 pg (ref 26.0–34.0)
MCHC: 33.6 g/dL (ref 30.0–36.0)
MCV: 94.4 fL (ref 80.0–100.0)
Monocytes Absolute: 0.4 10*3/uL (ref 0.1–1.0)
Monocytes Relative: 7 %
Neutro Abs: 4 10*3/uL (ref 1.7–7.7)
Neutrophils Relative %: 64 %
Platelets: 281 10*3/uL (ref 150–400)
RBC: 4.5 MIL/uL (ref 3.87–5.11)
RDW: 12.7 % (ref 11.5–15.5)
WBC: 6.2 10*3/uL (ref 4.0–10.5)
nRBC: 0 % (ref 0.0–0.2)

## 2020-07-12 LAB — BASIC METABOLIC PANEL
Anion gap: 8 (ref 5–15)
BUN: 16 mg/dL (ref 6–20)
CO2: 25 mmol/L (ref 22–32)
Calcium: 9.1 mg/dL (ref 8.9–10.3)
Chloride: 104 mmol/L (ref 98–111)
Creatinine, Ser: 0.78 mg/dL (ref 0.44–1.00)
GFR, Estimated: >60 mL/min (ref >60)
Glucose, Bld: 94 mg/dL (ref 70–99)
Potassium: 3.8 mmol/L (ref 3.5–5.1)
Sodium: 137 mmol/L (ref 135–145)

## 2020-07-12 LAB — D-DIMER, QUANTITATIVE: D-Dimer, Quant: 0.44 ug/mL-FEU (ref 0.00–0.50)

## 2020-07-12 LAB — TROPONIN I (HIGH SENSITIVITY)
Troponin I (High Sensitivity): 20 ng/L — ABNORMAL HIGH (ref <18)
Troponin I (High Sensitivity): 21 ng/L — ABNORMAL HIGH (ref <18)

## 2020-07-12 LAB — MAGNESIUM: Magnesium: 1.8 mg/dL (ref 1.7–2.4)

## 2020-07-12 MED ORDER — ALUM & MAG HYDROXIDE-SIMETH 200-200-20 MG/5ML PO SUSP
30.0000 mL | Freq: Once | ORAL | Status: AC
  Administered 2020-07-13: 30 mL via ORAL
  Filled 2020-07-12: qty 30

## 2020-07-12 MED ORDER — DIPHENHYDRAMINE HCL 25 MG PO CAPS
25.0000 mg | ORAL_CAPSULE | Freq: Once | ORAL | Status: AC
  Administered 2020-07-13: 25 mg via ORAL
  Filled 2020-07-12: qty 1

## 2020-07-12 MED ORDER — LIDOCAINE VISCOUS HCL 2 % MT SOLN
15.0000 mL | Freq: Once | OROMUCOSAL | Status: AC
  Administered 2020-07-13: 15 mL via ORAL
  Filled 2020-07-12: qty 15

## 2020-07-12 NOTE — ED Provider Notes (Signed)
Emergency Medicine Provider Triage Evaluation Note  Kathy Velasquez , a 25 y.o. female  was evaluated in triage.  Pt complains of central chest burning sensation that started at 6 AM yesterday and radiates around to both chest but not into her neck or shoulders.  She denies any associated shortness of breath, chest tightness, but does endorse some palpitations.  She has extensive history of anxiety, and her symptoms have been relieved by her anxiety medications..  Was seen in the ED triage yesterday and left before being moved to a room in the back. Did have elevated troponin 25 on chart review from yesterday's laboratory studies, however EKG yesterday was reassuring with normal sinus rhythm without ischemic change.  She does have history of untreated GERD.  Review of Systems  Positive: Chest pain, chest tightness, palpitations Negative: Shortness of breath, cough, syncope  Physical Exam  There were no vitals taken for this visit. Gen:   Awake, no distress   Resp:  Normal effort  MSK:   Moves extremities without difficulty  Other:  RRR, no M/R/G.  Lungs CTA B  Medical Decision Making  Medically screening exam initiated at 6:37 PM.  Appropriate orders placed.  Kathy Velasquez was informed that the remainder of the evaluation will be completed by another provider, this initial triage assessment does not replace that evaluation, and the importance of remaining in the ED until their evaluation is complete.  This chart was dictated using voice recognition software, Dragon. Despite the best efforts of this provider to proofread and correct errors, errors may still occur which can change documentation meaning.    [name] 07/12/20 1901    Koleen Distance, MD 07/13/20 1253

## 2020-07-12 NOTE — ED Triage Notes (Signed)
Patient seen yesterday for anxiety and chest pain and today pain and anxiety ongoing. States that her medication is not working. Patient appears anxious on assessment

## 2020-07-12 NOTE — ED Provider Notes (Signed)
[facility]   CSN: [id number] Arrival date & time: 07/12/20  1833     History No chief complaint on file.   Kathy Velasquez is a 25 y.o. female.  The history is provided by the patient and medical records.  Chest Pain Pain location:  Substernal area Pain quality: pressure and tightness   Pain radiates to:  Does not radiate Pain severity:  Moderate Onset quality:  Gradual Duration:  2 days Timing:  Constant Progression:  Unchanged Chronicity:  New Context: breathing and at rest   Context: not drug use, not eating, not intercourse, not lifting, not movement, not raising an arm, not stress and not trauma   Relieved by:  Nothing Worsened by:  Deep breathing Ineffective treatments:  Leaning forward, rest, certain positions and antacids Associated symptoms: anxiety and shortness of breath   Associated symptoms: no abdominal pain, no AICD problem, no altered mental status, no anorexia, no back pain, no claudication, no cough, no diaphoresis, no dizziness, no dysphagia, no fatigue, no fever, no headache, no heartburn, no lower extremity edema, no nausea, no near-syncope, no numbness, no orthopnea, no palpitations, no PND, no syncope, no vomiting and no weakness   Risk factors: obesity   Risk factors: no aortic disease, no birth control, no coronary artery disease, no diabetes mellitus, no Ehlers-Danlos syndrome, no high cholesterol, no hypertension, no immobilization, not pregnant, no prior DVT/PE, no smoking and no surgery    Presented to the emergency department yesterday with similar symptoms.  She had lab work drawn however eloped prior to being seen by a physician.  Her troponin that was drawn was elevated at 25. She has had ongoing chest pain since yesterday at 6 AM.     Past Medical History:  Diagnosis Date   Anxiety    Depression    Hernia of abdominal cavity    PCOS (polycystic ovarian syndrome)    PTSD (post-traumatic  stress disorder)     There are no problems to display for this patient.   No past surgical history on file.   OB History   No obstetric history on file.     Family History  Problem Relation Age of Onset   Cancer Mother     Social History   Tobacco Use   Smoking status: Never   Smokeless tobacco: Current  Vaping Use   Vaping Use: Every day  Substance Use Topics   Alcohol use: Yes   Drug use: Never    Home Medications Prior to Admission medications   Medication Sig Start Date End Date Taking? Authorizing Provider  acetaminophen (TYLENOL) 325 MG tablet Take 650 mg by mouth every 6 (six) hours as needed for mild pain.   Yes [provider]  betamethasone dipropionate 0.05 % cream Apply 1 application topically 2 (two) times daily as needed (psoriasis). 06/02/20  Yes [provider]  dicyclomine (BENTYL) 10 MG capsule Take 10 mg by mouth 2 (two) times daily as needed for spasms. 07/09/20  Yes [provider]  DULoxetine (CYMBALTA) 20 MG capsule Take 20 mg by mouth daily. 07/09/20  Yes [provider]  hydrOXYzine (ATARAX/VISTARIL) 25 MG tablet Take 1 tablet (25 mg total) by mouth every 6 (six) hours as needed for anxiety. 12/05/19  Yes Yu, Amy V, PA-C  Multiple Vitamin (MULTIVITAMIN ADULT PO) Take 1 tablet by mouth daily.   Yes [provider]  Vitamin D, Cholecalciferol, 10 MCG (400 UNIT) CAPS Take 800 Units by  mouth daily.   Yes [provider]    Allergies    Patient has no known allergies.  Review of Systems   Review of Systems  Constitutional:  Negative for chills, diaphoresis, fatigue and fever.  HENT:  Negative for ear pain, sore throat and trouble swallowing.   Eyes:  Negative for pain and visual disturbance.  Respiratory:  Positive for chest tightness and shortness of breath. Negative for cough.   Cardiovascular:  Positive for chest pain. Negative for palpitations, orthopnea, claudication, leg swelling, syncope,  PND and near-syncope.  Gastrointestinal:  Negative for abdominal pain, anorexia, heartburn, nausea and vomiting.  Genitourinary:  Negative for dysuria and hematuria.  Musculoskeletal:  Negative for arthralgias and back pain.  Skin:  Negative for color change and rash.  Neurological:  Negative for dizziness, seizures, syncope, weakness, numbness and headaches.  All other systems reviewed and are negative.  Physical Exam Updated Vital Signs BP (!) 116/92   Pulse 70   Temp 97.7 F (36.5 C) (Oral)   Resp 17   SpO2 99%   Physical Exam Vitals and nursing note reviewed.  Constitutional:      General: She is not in acute distress.    Appearance: She is well-developed. She is obese. She is not ill-appearing or toxic-appearing.  HENT:     Head: Normocephalic and atraumatic.  Eyes:     Conjunctiva/sclera: Conjunctivae normal.  Cardiovascular:     Rate and Rhythm: Normal rate and regular rhythm.     Pulses: Normal pulses.          Radial pulses are 2+ on the right side and 2+ on the left side.       Dorsalis pedis pulses are 2+ on the right side and 2+ on the left side.     Heart sounds: Normal heart sounds. No murmur heard. Pulmonary:     Effort: Pulmonary effort is normal. No respiratory distress.     Breath sounds: Normal breath sounds.  Chest:    Abdominal:     Palpations: Abdomen is soft.     Tenderness: There is no abdominal tenderness.  Musculoskeletal:     Cervical back: Neck supple.     Right lower leg: No edema.     Left lower leg: No edema.     Comments:  No lower extremity edema.  No rashes, warmth or wounds.  No tenderness to palpation of the posterior legs.  Skin:    General: Skin is warm and dry.  Neurological:     General: No focal deficit present.     Mental Status: She is alert and oriented to person, place, and time.     GCS: GCS eye subscore is 4. GCS verbal subscore is 5. GCS motor subscore is 6.     Cranial Nerves: Cranial nerves are intact.      Sensory: Sensation is intact.     Motor: Motor function is intact.    ED Results / Procedures / Treatments   Labs (all labs ordered are listed, but only abnormal results are displayed) Labs Reviewed  TROPONIN I (HIGH SENSITIVITY) - Abnormal; Notable for the following components:      Result Value   Troponin I (High Sensitivity) 20 (*)    All other components within normal limits  TROPONIN I (HIGH SENSITIVITY) - Abnormal; Notable for the following components:   Troponin I (High Sensitivity) 21 (*)    All other components within normal limits  BASIC METABOLIC PANEL  CBC WITH DIFFERENTIAL/PLATELET  MAGNESIUM  TSH  D-DIMER, QUANTITATIVE    EKG EKG Interpretation  Date/Time:  Saturday July 12 2020 18:40:31 EDT Ventricular Rate:  93 PR Interval:  130 QRS Duration: 68 QT Interval:  352 QTC Calculation: 437 R Axis:   90 Text Interpretation: Normal sinus rhythm Rightward axis Borderline ECG Confirmed by Alvester Chou [phone]) on 07/12/2020 10:33:38 PM  Radiology DG Chest 2 View  Result Date: 07/12/2020 CLINICAL DATA:  Chest pain. EXAM: CHEST - 2 VIEW COMPARISON:  July 11, 2020 FINDINGS: The heart size and mediastinal contours are within normal limits. Both lungs are clear. The visualized skeletal structures are unremarkable. IMPRESSION: No active cardiopulmonary disease. Electronically Signed   By: Ted Mcalpine M.D.   On: 07/12/2020 19:22   DG Chest 2 View  Result Date: 07/11/2020 CLINICAL DATA:  Chest pain and dizziness EXAM: CHEST - 2 VIEW COMPARISON:  November 18, 2019 FINDINGS: Lungs are clear. Heart size and pulmonary vascularity are normal. No adenopathy. No pneumothorax. No bone lesions. IMPRESSION: Lungs clear.  Cardiac silhouette within normal limits. Electronically Signed   By: Bretta Bang III M.D.   On: 07/11/2020 14:12    Procedures Procedures   Medications Ordered in ED Medications  alum & mag hydroxide-simeth (MAALOX/MYLANTA) 200-200-20 MG/5ML  suspension 30 mL (has no administration in time range)    And  lidocaine (XYLOCAINE) 2 % viscous mouth solution 15 mL (has no administration in time range)  diphenhydrAMINE (BENADRYL) capsule 25 mg (has no administration in time range)    ED Course  I have reviewed the triage vital signs and the nursing notes.  Pertinent labs & imaging results that were available during my care of the patient were reviewed by me and considered in my medical decision making (see chart for details).  Clinical Course as of 07/12/20 2346  Sat Jul 12, 2020  2231 25 yo female presenting with chest pain, eloped from ED waiting room yesterday due to wait times.  Here with constant chest pain, substernal since yesterday.  Trop 20 yesterday, 21 today.  BMP and CBC normal.  DG chest unremarkable.  ECG normal.  Pending Ddimer for PE workup. [MT]  2322 My repeat assessment the patient's vital signs remained normal.  She now has a non-itchy, flat red rash that spread from her chest to her back.  Is not clear if this is an allergic reaction.  She has no signs of airway compromise.  I discussed giving her Benadryl for this.  We also discussed trying a GI cocktail.  Her D-dimer is negative both her troponin elevation description of her symptoms, pleuritic and dypsnea, I would proceed with a CT scan regardless [MT]  2341 Pt signed out to Dr Wilkie Aye EDP pending CT PE follow up [MT]    Clinical Course User Index [MT] Renaye Rakers Kermit Balo, MD   MDM Rules/Calculators/A&P                          Patient is a 25 year old female with a history of PCOS, obesity and anxiety who presented to the emergency department with 2 days of substernal chest pain and associated shortness of breath. She came to the emergency department last night and had lab work drawn however eloped prior to being evaluated by a physician.  Her troponin resulted positive at 25. I reviewed all lab work and imaging from yesterday's emergency department visit and it  was considered my decision making.  Has had my exam she was  still having chest pain.  It does have a pleuritic component.  With her history of obesity, PCOS it does raise my suspicion for pulmonary embolism slightly.  However, no birth control, estrogen therapy, previous VTE, physical exam findings that would suggest DVT and she is not hypoxic or really tachycardic. Dimer was negative however with concerning story, I ordered a CTA PE study to further eval for causes of her CP.  Either way, patient will require admission for further w/u with consistently elevated troponins.  Handoff to oncoming team at 2330 pending CTA and admission.  Final Clinical Impression(s) / ED Diagnoses Final diagnoses:  None    Rx / DC Orders ED Discharge Orders     None        Ardeen Fillers, DO 07/12/20 2346    Terald Sleeper, MD 07/13/20 1023

## 2020-07-13 ENCOUNTER — Observation Stay (HOSPITAL_BASED_OUTPATIENT_CLINIC_OR_DEPARTMENT_OTHER): Payer: Commercial Managed Care - PPO

## 2020-07-13 ENCOUNTER — Emergency Department (HOSPITAL_COMMUNITY): Payer: Commercial Managed Care - PPO

## 2020-07-13 ENCOUNTER — Encounter (HOSPITAL_COMMUNITY): Payer: Self-pay | Admitting: Internal Medicine

## 2020-07-13 DIAGNOSIS — F17290 Nicotine dependence, other tobacco product, uncomplicated: Secondary | ICD-10-CM | POA: Diagnosis not present

## 2020-07-13 DIAGNOSIS — F411 Generalized anxiety disorder: Secondary | ICD-10-CM | POA: Diagnosis not present

## 2020-07-13 DIAGNOSIS — R079 Chest pain, unspecified: Secondary | ICD-10-CM

## 2020-07-13 DIAGNOSIS — K219 Gastro-esophageal reflux disease without esophagitis: Secondary | ICD-10-CM | POA: Diagnosis present

## 2020-07-13 LAB — SEDIMENTATION RATE: Sed Rate: 63 mm/hr — ABNORMAL HIGH (ref 0–22)

## 2020-07-13 LAB — RAPID URINE DRUG SCREEN, HOSP PERFORMED
Amphetamines: NOT DETECTED
Barbiturates: NOT DETECTED
Benzodiazepines: NOT DETECTED
Cocaine: NOT DETECTED
Opiates: NOT DETECTED
Tetrahydrocannabinol: NOT DETECTED

## 2020-07-13 LAB — RESP PANEL BY RT-PCR (FLU A&B, COVID) ARPGX2
Influenza A by PCR: NEGATIVE
Influenza B by PCR: NEGATIVE
SARS Coronavirus 2 by RT PCR: NEGATIVE

## 2020-07-13 LAB — ECHOCARDIOGRAM COMPLETE
Area-P 1/2: 3.65 cm2
S' Lateral: 2.5 cm

## 2020-07-13 LAB — TROPONIN I (HIGH SENSITIVITY): Troponin I (High Sensitivity): 12 ng/L (ref <18)

## 2020-07-13 LAB — C-REACTIVE PROTEIN: CRP: 1 mg/dL — ABNORMAL HIGH (ref <1.0)

## 2020-07-13 LAB — HIV ANTIBODY (ROUTINE TESTING W REFLEX): HIV Screen 4th Generation wRfx: NONREACTIVE

## 2020-07-13 MED ORDER — ONDANSETRON HCL 4 MG/2ML IJ SOLN: 4.0000 mg | Freq: Four times a day (QID) | INTRAMUSCULAR | Status: DC | PRN

## 2020-07-13 MED ORDER — PANTOPRAZOLE SODIUM 40 MG PO TBEC
40.0000 mg | DELAYED_RELEASE_TABLET | Freq: Two times a day (BID) | ORAL | Status: DC
  Administered 2020-07-13 – 2020-07-14 (×3): 40 mg via ORAL
  Filled 2020-07-13 (×3): qty 1

## 2020-07-13 MED ORDER — IOHEXOL 350 MG/ML SOLN
100.0000 mL | Freq: Once | INTRAVENOUS | Status: AC | PRN
  Administered 2020-07-13: 100 mL via INTRAVENOUS

## 2020-07-13 MED ORDER — ENOXAPARIN SODIUM 40 MG/0.4ML IJ SOSY
40.0000 mg | PREFILLED_SYRINGE | Freq: Every day | INTRAMUSCULAR | Status: DC
  Filled 2020-07-13: qty 0.4

## 2020-07-13 MED ORDER — ACETAMINOPHEN 325 MG PO TABS
650.0000 mg | ORAL_TABLET | ORAL | Status: DC | PRN
  Administered 2020-07-13 – 2020-07-14 (×2): 650 mg via ORAL
  Filled 2020-07-13 (×2): qty 2

## 2020-07-13 MED ORDER — PANTOPRAZOLE SODIUM 40 MG PO TBEC
40.0000 mg | DELAYED_RELEASE_TABLET | Freq: Every day | ORAL | Status: DC
  Administered 2020-07-13: 40 mg via ORAL
  Filled 2020-07-13: qty 1

## 2020-07-13 MED ORDER — SUCRALFATE 1 GM/10ML PO SUSP
1.0000 g | Freq: Three times a day (TID) | ORAL | Status: DC
  Administered 2020-07-13 – 2020-07-14 (×5): 1 g via ORAL
  Filled 2020-07-13 (×6): qty 10

## 2020-07-13 MED ORDER — ALPRAZOLAM 0.5 MG PO TABS: 1.0000 mg | ORAL_TABLET | Freq: Three times a day (TID) | ORAL | Status: DC | PRN

## 2020-07-13 MED ORDER — POLYETHYLENE GLYCOL 3350 17 G PO PACK: 17.0000 g | PACK | Freq: Every day | ORAL | Status: DC | PRN

## 2020-07-13 MED ORDER — DULOXETINE HCL 20 MG PO CPEP
20.0000 mg | ORAL_CAPSULE | Freq: Every day | ORAL | Status: DC
  Administered 2020-07-13 – 2020-07-15 (×3): 20 mg via ORAL
  Filled 2020-07-13 (×4): qty 1

## 2020-07-13 MED ORDER — HYDROXYZINE HCL 25 MG PO TABS: 25.0000 mg | ORAL_TABLET | Freq: Four times a day (QID) | ORAL | Status: DC | PRN

## 2020-07-13 NOTE — H&P (Signed)
History and Physical    Kathy Velasquez ZOX:[id number] DOB: [birth year] DOA: 07/12/2020  PCP: Patient, No Pcp Per (Inactive)  Patient coming from: Home   Chief Complaint: No chief complaint on file.    HPI:    25 year old female with past medical history of gastroesophageal reflux disease, generalized anxiety disorder and nicotine dependence who presents to hospital emergency room with complaints of chest discomfort.  Patient explains that since Friday she has been experiencing chest discomfort.  Chest discomfort originally awoke her from sleep early Friday morning.  Patient reports a sense of "intense dread" the evening prior.  Patient describes the chest discomfort as midsternal in location, starting off as a burning but later becoming more more sharp as it intensifies.  Pain is waxing and waning but does not seem to change based on exertion or position.  Chest discomfort seems to radiate diffusely throughout the anterior chest as well as occasionally radiating to the jaw.  Later in the day on Friday patient did originally present to [facility] emergency department for evaluation of her chest discomfort.  Patient left without being seen by the ER provider after her triage assessment.  Patient also was seen in the local urgent care clinic and was told that she may be suffering from anxiety and was prescribed hydroxyzine.  Patient reports that the as needed hydroxyzine does not seem to alleviate her symptoms.  Patient does admit to intermittent bouts of intense panic that she feels is a panic attack.  She feels that these episodes of panic are secondary to the chest discomfort and not the primary cause.  Patient's symptoms continue to persist and on Saturday the patient eventually presented to [facility] emergency department again for evaluation.  Upon evaluation in the emergency department EKG was found to be unremarkable.  D-dimer was found to be normal.  Chest x-ray was found to be  unremarkable.  Patient underwent CT angiogram of the chest that revealed no evidence of pulmonary embolism.  Patient was found to have slightly elevated troponins of 20 and 21.  Case was discussed with Dr. Ailene Ravel with cardiology who recommended overnight observation for serial troponins, inflammatory markers including ESR and CRP and echocardiogram in the morning.  The hospitalist group has now been called to assess the patient for admission the hospital.  Review of Systems:   Review of Systems  Cardiovascular:  Positive for chest pain.  All other systems reviewed and are negative.  Past Medical History:  Diagnosis Date   Anxiety    Depression    Hernia of abdominal cavity    PCOS (polycystic ovarian syndrome)    PTSD (post-traumatic stress disorder)    Umbilical hernia 08/25/1912    History reviewed. No pertinent surgical history.   reports that she has never smoked. She uses smokeless tobacco. She reports current alcohol use. She reports that she does not use drugs.  No Known Allergies  Family History  Problem Relation Age of Onset   Cancer Mother      Prior to Admission medications   Medication Sig Start Date End Date Taking? Authorizing Provider  acetaminophen (TYLENOL) 325 MG tablet Take 650 mg by mouth every 6 (six) hours as needed for mild pain.   Yes [provider]  betamethasone dipropionate 0.05 % cream Apply 1 application topically 2 (two) times daily as needed (psoriasis). 06/02/20  Yes [provider]  dicyclomine (BENTYL) 10 MG capsule Take 10 mg by mouth 2 (two) times daily as needed for spasms.  07/09/20  Yes [provider]  DULoxetine (CYMBALTA) 20 MG capsule Take 20 mg by mouth daily. 07/09/20  Yes [provider]  hydrOXYzine (ATARAX/VISTARIL) 25 MG tablet Take 1 tablet (25 mg total) by mouth every 6 (six) hours as needed for anxiety. 12/05/19  Yes Yu, Amy V, PA-C  Multiple Vitamin (MULTIVITAMIN ADULT PO) Take 1 tablet by mouth  daily.   Yes [provider]  Vitamin D, Cholecalciferol, 10 MCG (400 UNIT) CAPS Take 800 Units by mouth daily.   Yes [provider]    Physical Exam: Vitals:   07/13/20 0200 07/13/20 0215 07/13/20 0230 07/13/20 0245  BP: 134/72 139/72 125/78 126/80  Pulse: 72 62 60 62  Resp: (!) 21 15 (!) 21 (!) 21  Temp:      TempSrc:      SpO2: 97% 95% 95% 95%    Constitutional: Awake alert and oriented x3, no associated distress.   Skin: no rashes, no lesions, good skin turgor noted. Eyes: Pupils are equally reactive to light.  No evidence of scleral icterus or conjunctival pallor.  ENMT: Moist mucous membranes noted.  Posterior pharynx clear of any exudate or lesions.   Neck: normal, supple, no masses, no thyromegaly.  No evidence of jugular venous distension.   Respiratory: clear to auscultation bilaterally, no wheezing, no crackles. Normal respiratory effort. No accessory muscle use.  Cardiovascular: Regular rate and rhythm, no murmurs / rubs / gallops. No extremity edema. 2+ pedal pulses. No carotid bruits.  Chest:   Nontender without crepitus or deformity.   Back:   Nontender without crepitus or deformity. Abdomen: Abdomen is soft and nontender.  No evidence of intra-abdominal masses.  Positive bowel sounds noted in all quadrants.   Musculoskeletal: No joint deformity upper and lower extremities. Good ROM, no contractures. Normal muscle tone.  Neurologic: CN 2-12 grossly intact. Sensation intact.  Patient moving all 4 extremities spontaneously.  Patient is following all commands.  Patient is responsive to verbal stimuli.   Psychiatric: Patient exhibits anxious mood with appropriate affect.  Patient seems to possess insight as to their current situation.     Labs on Admission: I have personally reviewed following labs and imaging studies -   CBC: Recent Labs  Lab 07/11/20 1315 07/12/20 1908  WBC 6.3 6.2  NEUTROABS 4.2 4.0  HGB 14.3 14.3  HCT 42.3 42.5  MCV 93.0 94.4   PLT 275 093   Basic Metabolic Panel: Recent Labs  Lab 07/11/20 1315 07/12/20 1908  NA 135 137  K 4.0 3.8  CL 103 104  CO2 23 25  GLUCOSE 99 94  BUN 15 16  CREATININE 0.78 0.78  CALCIUM 9.3 9.1  MG  --  1.8   GFR: CrCl cannot be calculated (Unknown ideal weight.). Liver Function Tests: Recent Labs  Lab 07/11/20 1315  AST 20  ALT 38  ALKPHOS 76  BILITOT 0.9  PROT 7.5  ALBUMIN 4.0   Recent Labs  Lab 07/11/20 1315  LIPASE 24   No results for input(s): AMMONIA in the last 168 hours. Coagulation Profile: No results for input(s): INR, PROTIME in the last 168 hours. Cardiac Enzymes: No results for input(s): CKTOTAL, CKMB, CKMBINDEX, TROPONINI in the last 168 hours. BNP (last 3 results) No results for input(s): PROBNP in the last 8760 hours. HbA1C: No results for input(s): HGBA1C in the last 72 hours. CBG: No results for input(s): GLUCAP in the last 168 hours. Lipid Profile: No results for input(s): CHOL, HDL, LDLCALC, TRIG,  CHOLHDL, LDLDIRECT in the last 72 hours. Thyroid Function Tests: Recent Labs    07/12/20 1908  TSH 1.062   Anemia Panel: No results for input(s): VITAMINB12, FOLATE, FERRITIN, TIBC, IRON, RETICCTPCT in the last 72 hours. Urine analysis:    Component Value Date/Time   COLORURINE YELLOW 05/16/2020 [place] 05/16/2020 1745   LABSPEC 1.018 05/16/2020 1745   PHURINE 5.0 05/16/2020 1745   GLUCOSEU NEGATIVE 05/16/2020 1745   HGBUR NEGATIVE 05/16/2020 1745   BILIRUBINUR NEGATIVE 05/16/2020 1745   KETONESUR NEGATIVE 05/16/2020 1745   PROTEINUR NEGATIVE 05/16/2020 1745   NITRITE NEGATIVE 05/16/2020 1745   LEUKOCYTESUR NEGATIVE 05/16/2020 1745    Radiological Exams on Admission - Personally Reviewed: DG Chest 2 View  Result Date: 07/12/2020 CLINICAL DATA:  Chest pain. EXAM: CHEST - 2 VIEW COMPARISON:  July 11, 2020 FINDINGS: The heart size and mediastinal contours are within normal limits. Both lungs are clear. The  visualized skeletal structures are unremarkable. IMPRESSION: No active cardiopulmonary disease. Electronically Signed   By: Fidela Salisbury M.D.   On: 07/12/2020 19:22   DG Chest 2 View  Result Date: 07/11/2020 CLINICAL DATA:  Chest pain and dizziness EXAM: CHEST - 2 VIEW COMPARISON:  November 18, 2019 FINDINGS: Lungs are clear. Heart size and pulmonary vascularity are normal. No adenopathy. No pneumothorax. No bone lesions. IMPRESSION: Lungs clear.  Cardiac silhouette within normal limits. Electronically Signed   By: Lowella Grip III M.D.   On: 07/11/2020 14:12   CT Angio Chest PE W and/or Wo Contrast  Result Date: 07/13/2020 CLINICAL DATA:  Pulmonary embolus is suspected with high probability. Chest pain. EXAM: CT ANGIOGRAPHY CHEST WITH CONTRAST TECHNIQUE: Multidetector CT imaging of the chest was performed using the standard protocol during bolus administration of intravenous contrast. Multiplanar CT image reconstructions and MIPs were obtained to evaluate the vascular anatomy. CONTRAST:  149m OMNIPAQUE IOHEXOL 350 MG/ML SOLN COMPARISON:  Chest radiograph 07/12/2020 FINDINGS: Cardiovascular: There is moderately good opacification of the central and segmental pulmonary arteries. No focal filling defects are demonstrated. No evidence of significant pulmonary embolus. Normal heart size. No pericardial effusions. Normal caliber thoracic aorta. Mediastinum/Nodes: No significant lymphadenopathy. Esophagus is decompressed. Lungs/Pleura: Lungs are clear. No pleural effusions. No pneumothorax. Upper Abdomen: No acute abnormalities demonstrated in the visualized upper abdomen. Musculoskeletal: No chest wall abnormality. No acute or significant osseous findings. Review of the MIP images confirms the above findings. IMPRESSION: No evidence of significant pulmonary embolus. No evidence of active pulmonary disease. Electronically Signed   By: WLucienne CapersM.D.   On: 07/13/2020 01:16    EKG: Personally  reviewed.  Rhythm is normal sinus rhythm with heart rate of 93 bpm.  No dynamic ST segment changes appreciated.  Assessment/Plan Principal Problem:   Chest pain  Patient complaining of a 2 to 3-day history of extremely atypical chest discomfort. Work-up so far has been negative with exception of very slightly elevated high-sensitivity troponins. Monitoring patient on telemetry Continue to cycle cardiac enzymes Obtaining ESR and CRP Obtaining echocardiogram in the morning GI cocktail has been administered with no improvement in patient's symptoms Pulmonary embolism has been ruled out via CTA chest. ER provider discussed case with Dr. CRenella Cunascardiology who recommended overnight observation, ESR, CRP, echocardiogram.  We will formally consult cardiology if any of the work-up is abnormal.  Active Problems:   Generalized anxiety disorder  Continue home regimen of duloxetine and as needed hydroxyzine    GERD without esophagitis  Patient states that her  acid reflux symptoms are typically pretty infrequent however will place patient on Protonix 40 mg by mouth daily.    Nicotine dependence, other tobacco product, uncomplicated  Counseling patient on cessation daily.     Code Status:  Full code Family Communication: deferred   Status is: Observation  The patient remains OBS appropriate and will d/c before 2 midnights.  Dispo: The patient is from: Home              Anticipated d/c is to: Home              Patient currently is not medically stable to d/c.   Difficult to place patient No        Vernelle Emerald MD [facility] Pager [phone]  If 7PM-7AM, please contact night-coverage [email or web address] Use universal [place] password for that web site. If you do not have the password, please call the hospital operator.  07/13/2020, 3:33 AM

## 2020-07-13 NOTE — ED Notes (Signed)
Attempted to call nursing report to 2C 

## 2020-07-13 NOTE — Progress Notes (Signed)
  Echocardiogram 2D Echocardiogram has been performed.  Delcie Roch 07/13/2020, 1:52 PM

## 2020-07-13 NOTE — ED Notes (Signed)
Pt transported to CT ?

## 2020-07-13 NOTE — Progress Notes (Signed)
**Note Kathy Velasquez** PROGRESS NOTE    Lynnell Fiumara  JGG:[id number] DOB: [birth year] DOA: 07/12/2020 PCP: Patient, No Pcp Per (Inactive)    Brief Narrative:  Mrs. Devincent was admitted to the hospital with the working diagnosis of chest pain.   25 year old female past medical history for GERD, generalized anxiety disorder nicotine dependence who presented with chest pain.  Reported severe precordial chest pain, burning/sharp in nature, intermittent, nonexertional or positional related.  Radiated to anterior chest wall.  Her blood pressure was 134/72, heart rate 72, respirate 21, oxygen saturation 95%, her lungs are clear to auscultation bilaterally, heart S1-S2, present, rhythmic, soft abdomen, no lower extremity edema.  Sodium 137, potassium 3.8, chloride 104, bicarb 25, glucose 94, BUN 16, creatinine 0.78, magnesium 1.8, high sensitive troponin 20-21-12, white count 6.2, hemoglobin 14.3, hematocrit 42.5, platelets 281. SARS COVID-19 negative.  Toxicology screen negative.  CT chest negative for pulm embolism. Chest radiograph no infiltrates.  EKG 93 bpm, normal axis, normal intervals, sinus rhythm, no ST segment or T wave changes.  Assessment & Plan:   Principal Problem:   Chest pain Active Problems:   Generalized anxiety disorder   GERD without esophagitis   Nicotine dependence, other tobacco product, uncomplicated   Atypical chest pain/ GERD. Patient continue to have burning type chest pain, intermittent, with no nausea or vomiting. High sensitive troponin is 20-21-12 non ischemic pattern. EKG with no ischemic changes.   Plan to continue aggressive antiacid therapy with pantoprazole bid and sucralfate tid with meals. Continue close monitoring and follow up echocardiogram report. I suspect non cardiac chest pain.   2. Depression and anxiety. Continue with duloxetine. Change hydroxyzine to alprazolam.   3. Nicotine use. Add nicotine patch.     Status is: Observation  The patient remains OBS  appropriate and will d/c before 2 midnights.  Dispo: The patient is from: Home              Anticipated d/c is to: Home              Patient currently is not medically stable to d/c.   Difficult to place patient No       DVT prophylaxis: Enoxaparin   Code Status:   full  Family Communication:  No family at the bedside     Subjective: Patient is feeling better but continue to have precordial chest pain, burning in nature, intermittent, with no worsening factors.   Objective: Vitals:   07/13/20 1215 07/13/20 1230 07/13/20 1315 07/13/20 1500  BP: 106/74 (!) 100/53 116/82 111/79  Pulse: 77 68 92 85  Resp: (!) 21 20 18 20   Temp:      TempSrc:      SpO2: 97% 95% 96% 96%   No intake or output data in the 24 hours ending 07/13/20 1515 There were no vitals filed for this visit.  Examination:   General: Not in pain or dyspnea,  Neurology: Awake and alert, non focal  E ENT: no pallor, no icterus, oral mucosa moist Cardiovascular: No JVD. S1-S2 present, rhythmic, no gallops, rubs, or murmurs. No lower extremity edema. Pulmonary: positive breath sounds bilaterally, adequate air movement, no wheezing, rhonchi or rales. Gastrointestinal. Abdomen soft and non tender Skin. No rashes Musculoskeletal: no joint deformities     Data Reviewed: I have personally reviewed following labs and imaging studies  CBC: Recent Labs  Lab 07/11/20 1315 07/12/20 1908  WBC 6.3 6.2  NEUTROABS 4.2 4.0  HGB 14.3 14.3  HCT 42.3 42.5  MCV 93.0 94.4  PLT 275 281   Basic Metabolic Panel: Recent Labs  Lab 07/11/20 1315 07/12/20 1908  NA 135 137  K 4.0 3.8  CL 103 104  CO2 23 25  GLUCOSE 99 94  BUN 15 16  CREATININE 0.78 0.78  CALCIUM 9.3 9.1  MG  --  1.8   GFR: CrCl cannot be calculated (Unknown ideal weight.). Liver Function Tests: Recent Labs  Lab 07/11/20 1315  AST 20  ALT 38  ALKPHOS 76  BILITOT 0.9  PROT 7.5  ALBUMIN 4.0   Recent Labs  Lab 07/11/20 1315  LIPASE 24    No results for input(s): AMMONIA in the last 168 hours. Coagulation Profile: No results for input(s): INR, PROTIME in the last 168 hours. Cardiac Enzymes: No results for input(s): CKTOTAL, CKMB, CKMBINDEX, TROPONINI in the last 168 hours. BNP (last 3 results) No results for input(s): PROBNP in the last 8760 hours. HbA1C: No results for input(s): HGBA1C in the last 72 hours. CBG: No results for input(s): GLUCAP in the last 168 hours. Lipid Profile: No results for input(s): CHOL, HDL, LDLCALC, TRIG, CHOLHDL, LDLDIRECT in the last 72 hours. Thyroid Function Tests: Recent Labs    07/12/20 1908  TSH 1.062   Anemia Panel: No results for input(s): VITAMINB12, FOLATE, FERRITIN, TIBC, IRON, RETICCTPCT in the last 72 hours.    Radiology Studies: I have reviewed all of the imaging during this hospital visit personally     Scheduled Meds:  DULoxetine  20 mg Oral Daily   enoxaparin (LOVENOX) injection  40 mg Subcutaneous Daily   pantoprazole  40 mg Oral Daily   Continuous Infusions:   LOS: 0 days        Sandralee Tarkington Annett Gula, MD

## 2020-07-13 NOTE — ED Provider Notes (Signed)
Patient signed out pending CTA to rule out PE.  CTA is negative for PE.  On my evaluation, patient is still having some chest discomfort.  She states this has been going on for the last 3 days.  She has a known history of anxiety but states that she has never had chest pain.  Denies any recent illnesses or fevers.  No positional nature to the pain.  Given elevated troponins, potential for myocarditis or pericarditis is a consideration.  Feel patient warrants admission for echocardiogram.  I did discuss the patient with Dr. Cherly Beach, cardiology.  He agrees.  Recommends also getting CRP and sed rate for further information.  This was added.  We will plan to admit to the hospitalist and recommend echocardiogram.  If formal cardiology consultation is needed, they can call.   Physical Exam  BP 121/80   Pulse 69   Temp 97.7 F (36.5 C) (Oral)   Resp 17   SpO2 96%    ED Course/Procedures   Clinical Course as of 07/13/20 0205  Sat Jul 12, 2020  2231 25 yo female presenting with chest pain, eloped from ED waiting room yesterday due to wait times.  Here with constant chest pain, substernal since yesterday.  Trop 20 yesterday, 21 today.  BMP and CBC normal.  DG chest unremarkable.  ECG normal.  Pending Ddimer for PE workup. [MT]  2322 My repeat assessment the patient's vital signs remained normal.  She now has a non-itchy, flat red rash that spread from her chest to her back.  Is not clear if this is an allergic reaction.  She has no signs of airway compromise.  I discussed giving her Benadryl for this.  We also discussed trying a GI cocktail.  Her D-dimer is negative both her troponin elevation description of her symptoms, pleuritic and dypsnea, I would proceed with a CT scan regardless [MT]  2341 Pt signed out to Dr Wilkie Aye EDP pending CT PE follow up [MT]    Clinical Course User Index [MT] Renaye Rakers, Kermit Balo, MD    Procedures  MDM   Problem List Items Addressed This Visit   None Visit  Diagnoses     Elevated troponin    -  Primary   Atypical chest pain                 Wilkie Aye Mayer Masker, MD 07/13/20 0206

## 2020-07-13 NOTE — Progress Notes (Signed)
ED provider reached out regarding plan for Ms. Kathy Velasquez. 57F with GERD, GAD and tobacco use who presented with chest discomfort that she felt was different than prior anxiety attacks. Pain not related to exertion but midsternal location and felt like burning sensation. She was triaged on Friday but left before being seen and then went to a local urgent care for which she was given hydroxyzine with the thought that it was recurrent anxiety. She has hx of prior severe panic attacks but felt they were 2/2 chest discomfort. ED workup was only remarkable for mildly elevated hsT (20-21) with CTPE negative for PE. ED plan for medicine obs eval with echo in AM which I think is reasonable. Asked they add on ESR/CRP although not typical presentation for myocarditis. ECG without ischemic changes, NSR. Medicine will consult cardiology if echo abnormal or worsening sx.

## 2020-07-14 DIAGNOSIS — K219 Gastro-esophageal reflux disease without esophagitis: Secondary | ICD-10-CM | POA: Diagnosis not present

## 2020-07-14 DIAGNOSIS — F411 Generalized anxiety disorder: Secondary | ICD-10-CM | POA: Diagnosis not present

## 2020-07-14 DIAGNOSIS — R079 Chest pain, unspecified: Secondary | ICD-10-CM | POA: Diagnosis not present

## 2020-07-14 DIAGNOSIS — F17290 Nicotine dependence, other tobacco product, uncomplicated: Secondary | ICD-10-CM | POA: Diagnosis not present

## 2020-07-14 LAB — BASIC METABOLIC PANEL
Anion gap: 10 (ref 5–15)
BUN: 21 mg/dL — ABNORMAL HIGH (ref 6–20)
CO2: 23 mmol/L (ref 22–32)
Calcium: 8.7 mg/dL — ABNORMAL LOW (ref 8.9–10.3)
Chloride: 104 mmol/L (ref 98–111)
Creatinine, Ser: 0.95 mg/dL (ref 0.44–1.00)
GFR, Estimated: >60 mL/min (ref >60)
Glucose, Bld: 89 mg/dL (ref 70–99)
Potassium: 4 mmol/L (ref 3.5–5.1)
Sodium: 137 mmol/L (ref 135–145)

## 2020-07-14 MED ORDER — NICOTINE 14 MG/24HR TD PT24
14.0000 mg | MEDICATED_PATCH | Freq: Every day | TRANSDERMAL | Status: DC
  Filled 2020-07-14: qty 1

## 2020-07-14 MED ORDER — ENOXAPARIN SODIUM 40 MG/0.4ML IJ SOSY: 40.0000 mg | PREFILLED_SYRINGE | Freq: Every day | INTRAMUSCULAR | Status: DC

## 2020-07-14 MED ORDER — SODIUM CHLORIDE 0.9 % IV SOLN: INTRAVENOUS | Status: DC

## 2020-07-14 NOTE — H&P (View-Only) (Signed)
   Consultation  Referring Provider:    Dr. Arrien Primary Care Physician:  Patient, No Pcp Per (Inactive) Primary Gastroenterologist:   Unassigned      Reason for Consultation: Atypical chest pain            HPI:   Kathy Velasquez is a 25 y.o. female with a past medical history as listed below including anxiety and depression, GERD and nicotine dependence, who presented to the ER on 07/13/2018 with a complaint of chest discomfort.    At time of presentation patient described that since Friday, 07/11/2020 she had been experiencing chest discomfort which woke her from her sleep early that morning and a sense of "intense dread" the evening prior.  This is described as midsternal and burning but becoming more sharp and intensified.  Seemed to radiate diffusely throughout the anterior chest as well as occasionally rating to the jaw.  Apparently seen at the local urgent care clinic and told that she may be suffering from anxiety and was prescribed hydroxyzine.  It did not seem to help her symptoms.  Also described intermittent bouts of intense panic that she felt the panic attack.  Felt they were secondary to chest discomfort not the primary cause.    Today, the patient describes that she has a long history of generalized anxiety disorder and panic attacks, but on Friday morning woke up with "a feeling of dread", and later that day developed severe chest pain and tightness which seemed to be burning and radiating throughout her chest and out to the sides.  Tells me the night before she had dinner around 930 but this is typical for her as she works at the [employer] and does not go to bed until 3 or 4 in the morning.  Had nothing strange to eat.  Explains that eating did not seem to exacerbate symptoms.  Apparently presented to the ER initially but had a initial triage and was told this was likely a panic attack, after waiting for 3 hours she decided to leave.  Continued with pain and went to the urgent care and  was told this was still anxiety and was given Hydroxyzine.  Took the Hydroxyzine but this did not seem to help.  Pain continued into yesterday rated as a 9-10/10, but "felt different than my normal anxiety", so she presented to the ER.  Troponins were elevated and she had cardiac work-up which was found to be negative.  Tells me she continues with a burning in her chest which seems to be radiating to the sides which hurts worse if she takes a deep breath.  She has been eating a regular diet which does not seem to exacerbate symptoms.  Also using Protonix 40 twice a day and Carafate 4 times daily with no real change.    Describes a history of IBS-D, apparently following with her PCP.  Was recently given the low FODMAP diet but has not had a chance to start this yet.  The symptoms have not changed recently.    Denies fever, chills, weight loss, blood in her stool, nausea, vomiting or use of NSAIDs.  ED course: EKG unremarkable, D-dimer normal, chest x-ray unremarkable, CT angiogram of chest revealed no evidence of pulmonary embolism, slightly elevated troponins, 20 and 21  Past Medical History:  Diagnosis Date   Anxiety    Depression    Hernia of abdominal cavity    PCOS (polycystic ovarian syndrome)    PTSD (post-traumatic stress disorder)      Umbilical hernia 07/01/2020    History reviewed. No pertinent surgical history.  Family History  Problem Relation Age of Onset   Cancer Mother      Social History   Tobacco Use   Smoking status: Never   Smokeless tobacco: Current  Vaping Use   Vaping Use: Every day  Substance Use Topics   Alcohol use: Yes   Drug use: Never    Prior to Admission medications   Medication Sig Start Date End Date Taking? Authorizing Provider  acetaminophen (TYLENOL) 325 MG tablet Take 650 mg by mouth every 6 (six) hours as needed for mild pain.   Yes [provider]  betamethasone dipropionate 0.05 % cream Apply 1 application topically 2 (two) times  daily as needed (psoriasis). 06/02/20  Yes [provider]  dicyclomine (BENTYL) 10 MG capsule Take 10 mg by mouth 2 (two) times daily as needed for spasms. 07/09/20  Yes [provider]  DULoxetine (CYMBALTA) 20 MG capsule Take 20 mg by mouth daily. 07/09/20  Yes [provider]  hydrOXYzine (ATARAX/VISTARIL) 25 MG tablet Take 1 tablet (25 mg total) by mouth every 6 (six) hours as needed for anxiety. 12/05/19  Yes Yu, Amy V, PA-C  Multiple Vitamin (MULTIVITAMIN ADULT PO) Take 1 tablet by mouth daily.   Yes [provider]  Vitamin D, Cholecalciferol, 10 MCG (400 UNIT) CAPS Take 800 Units by mouth daily.   Yes [provider]    Current Facility-Administered Medications  Medication Dose Route Frequency Provider Last Rate Last Admin   acetaminophen (TYLENOL) tablet 650 mg  650 mg Oral Q4H PRN Shalhoub, Deno Lunger, MD   650 mg at 07/13/20 1516   ALPRAZolam (XANAX) tablet 1 mg  1 mg Oral TID PRN Arrien, York Ram, MD       DULoxetine (CYMBALTA) DR capsule 20 mg  20 mg Oral Daily Shalhoub, Deno Lunger, MD   20 mg at 07/14/20 0911   enoxaparin (LOVENOX) injection 40 mg  40 mg Subcutaneous Daily Shalhoub, Deno Lunger, MD       nicotine (NICODERM CQ - dosed in mg/24 hours) patch 14 mg  14 mg Transdermal Daily Arrien, York Ram, MD       ondansetron [facility]) injection 4 mg  4 mg Intravenous Q6H PRN Shalhoub, Deno Lunger, MD       pantoprazole (PROTONIX) EC tablet 40 mg  40 mg Oral BID Arrien, York Ram, MD   40 mg at 07/14/20 0911   polyethylene glycol (MIRALAX / GLYCOLAX) packet 17 g  17 g Oral Daily PRN Shalhoub, Deno Lunger, MD       sucralfate (CARAFATE) 1 GM/10ML suspension 1 g  1 g Oral TID WC & HS Arrien, York Ram, MD   1 g at 07/14/20 1202    Allergies as of 07/12/2020   (No Known Allergies)     Review of Systems:    Constitutional: No weight loss, fever or chills Skin: No rash  Cardiovascular: +chest pain, chest pressure Respiratory:  No SOB Gastrointestinal: See HPI and otherwise negative Genitourinary: No dysuria Neurological: No headache, dizziness or syncope Musculoskeletal: No new muscle or joint pain Hematologic: No bleeding Psychiatric: +anxiety   Physical Exam:  Vital signs in last 24 hours: Temp:  [97.5 F (36.4 C)-98.3 F (36.8 C)] 97.8 F (36.6 C) (06/20 1139) Pulse Rate:  [60-86] 86 (06/20 1139) Resp:  [16-20] 19 (06/20 1139) BP: (97-114)/(55-79) 114/78 (06/20 1139) SpO2:  [94 %-100 %] 95 % (06/20 1139) Last BM  Date: 07/13/20 General:   Pleasant overweight Caucasian female appears to be in NAD, Well developed, Well nourished, alert and cooperative Head:  Normocephalic and atraumatic. Eyes:   PEERL, EOMI. No icterus. Conjunctiva pink. Ears:  Normal auditory acuity. Neck:  Supple Throat: Oral cavity and pharynx without inflammation, swelling or lesion.  Lungs: Respirations even and unlabored. Lungs clear to auscultation bilaterally.   No wheezes, crackles, or rhonchi.  Heart: Normal S1, S2. No MRG. Regular rate and rhythm. No peripheral edema, cyanosis or pallor.  Abdomen:  Soft, nondistended, nontender. No rebound or guarding. Normal bowel sounds. No appreciable masses or hepatomegaly. Rectal:  Not performed.  Msk:  Symmetrical without gross deformities. Peripheral pulses intact.  Extremities:  Without edema, no deformity or joint abnormality.  Neurologic:  Alert and  oriented x4;  grossly normal neurologically.  Skin:   Dry and intact without significant lesions or rashes. Psychiatric: Demonstrates good judgement and reason without abnormal affect or behaviors.   LAB RESULTS: Recent Labs    07/12/20 1908  WBC 6.2  HGB 14.3  HCT 42.5  PLT 281   BMET Recent Labs    07/12/20 1908 07/14/20 0113  NA 137 137  K 3.8 4.0  CL 104 104  CO2 25 23  GLUCOSE 94 89  BUN 16 21*  CREATININE 0.78 0.95  CALCIUM 9.1 8.7*    STUDIES: DG Chest 2 View  Result Date: 07/12/2020 CLINICAL DATA:   Chest pain. EXAM: CHEST - 2 VIEW COMPARISON:  July 11, 2020 FINDINGS: The heart size and mediastinal contours are within normal limits. Both lungs are clear. The visualized skeletal structures are unremarkable. IMPRESSION: No active cardiopulmonary disease. Electronically Signed   By: Ted Mcalpineobrinka  Dimitrova M.D.   On: 07/12/2020 19:22   CT Angio Chest PE W and/or Wo Contrast  Result Date: 07/13/2020 CLINICAL DATA:  Pulmonary embolus is suspected with high probability. Chest pain. EXAM: CT ANGIOGRAPHY CHEST WITH CONTRAST TECHNIQUE: Multidetector CT imaging of the chest was performed using the standard protocol during bolus administration of intravenous contrast. Multiplanar CT image reconstructions and MIPs were obtained to evaluate the vascular anatomy. CONTRAST:  100mL OMNIPAQUE IOHEXOL 350 MG/ML SOLN COMPARISON:  Chest radiograph 07/12/2020 FINDINGS: Cardiovascular: There is moderately good opacification of the central and segmental pulmonary arteries. No focal filling defects are demonstrated. No evidence of significant pulmonary embolus. Normal heart size. No pericardial effusions. Normal caliber thoracic aorta. Mediastinum/Nodes: No significant lymphadenopathy. Esophagus is decompressed. Lungs/Pleura: Lungs are clear. No pleural effusions. No pneumothorax. Upper Abdomen: No acute abnormalities demonstrated in the visualized upper abdomen. Musculoskeletal: No chest wall abnormality. No acute or significant osseous findings. Review of the MIP images confirms the above findings. IMPRESSION: No evidence of significant pulmonary embolus. No evidence of active pulmonary disease. Electronically Signed   By: Burman NievesWilliam  Stevens M.D.   On: 07/13/2020 01:16   ECHOCARDIOGRAM COMPLETE  Result Date: 07/13/2020    ECHOCARDIOGRAM REPORT   Patient Name:   Kathy Velasquez Date of Exam: 07/13/2020 Medical Rec #:  [id number]      Height:       65.0 in Accession #:    [id number]     Weight:       245.0 lb Date of Birth:  15-May-1995       BSA:          2.156 m Patient Age:    25 years       BP:           100/53 mmHg Patient  Gender: F              HR:           92 bpm. Exam Location:  Inpatient Procedure: 2D Echo Indications:    chest pain  History:        Patient has no prior history of Echocardiogram examinations.  Sonographer:    Delcie Roch Referring Phys: [id number] Deno Lunger SHALHOUB IMPRESSIONS  1. Left ventricular ejection fraction, by estimation, is 65 to 70%. The left ventricle has hyperdynamic function. The left ventricle has no regional wall motion abnormalities. Left ventricular diastolic parameters were normal.  2. Right ventricular systolic function is normal. The right ventricular size is normal. There is normal pulmonary artery systolic pressure.  3. The mitral valve is normal in structure. No evidence of mitral valve regurgitation. No evidence of mitral stenosis.  4. The aortic valve is tricuspid. Aortic valve regurgitation is not visualized. No aortic stenosis is present.  5. The inferior vena cava is normal in size with greater than 50% respiratory variability, suggesting right atrial pressure of 3 mmHg. FINDINGS  Left Ventricle: Left ventricular ejection fraction, by estimation, is 65 to 70%. The left ventricle has hyperdynamic function. The left ventricle has no regional wall motion abnormalities. The left ventricular internal cavity size was normal in size. There is no left ventricular hypertrophy. Left ventricular diastolic parameters were normal. Right Ventricle: The right ventricular size is normal. No increase in right ventricular wall thickness. Right ventricular systolic function is normal. There is normal pulmonary artery systolic pressure. The tricuspid regurgitant velocity is 2.31 m/s, and  with an assumed right atrial pressure of 3 mmHg, the estimated right ventricular systolic pressure is 24.3 mmHg. Left Atrium: Left atrial size was normal in size. Right Atrium: Right atrial size was normal in size.  Pericardium: There is no evidence of pericardial effusion. Mitral Valve: The mitral valve is normal in structure. No evidence of mitral valve regurgitation. No evidence of mitral valve stenosis. Tricuspid Valve: The tricuspid valve is normal in structure. Tricuspid valve regurgitation is not demonstrated. No evidence of tricuspid stenosis. Aortic Valve: The aortic valve is tricuspid. Aortic valve regurgitation is not visualized. No aortic stenosis is present. Pulmonic Valve: The pulmonic valve was not well visualized. Pulmonic valve regurgitation is not visualized. No evidence of pulmonic stenosis. Aorta: The aortic root is normal in size and structure. Venous: The inferior vena cava is normal in size with greater than 50% respiratory variability, suggesting right atrial pressure of 3 mmHg. IAS/Shunts: No atrial level shunt detected by color flow Doppler.  LEFT VENTRICLE PLAX 2D LVIDd:         4.50 cm  Diastology LVIDs:         2.50 cm  LV e' medial:    11.30 cm/s LV PW:         0.80 cm  LV E/e' medial:  7.6 LV IVS:        0.70 cm  LV e' lateral:   13.70 cm/s LVOT diam:     1.90 cm  LV E/e' lateral: 6.3 LV SV:         45 LV SV Index:   21 LVOT Area:     2.84 cm  RIGHT VENTRICLE RV S prime:     16.50 cm/s TAPSE (M-mode): 2.6 cm LEFT ATRIUM             Index       RIGHT ATRIUM  Index LA diam:        3.50 cm 1.62 cm/m  RA Area:     11.80 cm LA Vol (A2C):   36.5 ml 16.93 ml/m RA Volume:   24.50 ml  11.36 ml/m LA Vol (A4C):   36.3 ml 16.83 ml/m LA Biplane Vol: 38.5 ml 17.85 ml/m  AORTIC VALVE LVOT Vmax:   92.10 cm/s LVOT Vmean:  60.800 cm/s LVOT VTI:    0.158 m  AORTA Ao Root diam: 2.70 cm Ao Asc diam:  2.50 cm MITRAL VALVE               TRICUSPID VALVE MV Area (PHT): 3.65 cm    TR Peak grad:   21.3 mmHg MV Decel Time: 208 msec    TR Vmax:        231.00 cm/s MV E velocity: 86.10 cm/s MV A velocity: 61.70 cm/s  SHUNTS MV E/A ratio:  1.40        Systemic VTI:  0.16 m                            Systemic  Diam: 1.90 cm Dina Rich MD Electronically signed by Dina Rich MD Signature Date/Time: 07/13/2020/2:45:40 PM    Final      Impression / Plan:   Impression: 1.  Atypical chest pain: With initially minimally elevated troponins, normal echo and EKG, GI cocktail administered in the ER with no improvement, CT normal, been ruled out for acute coronary syndrome overnight but continues to have severe pain despite antacid therapy; consider anxiety+/- functional dyspepsia versus other 2.  GERD: New for the patient, describes a burning in her chest 3.  Nicotine dependence 4.  Generalized anxiety disorder  Plan: 1.  We will plan for EGD tomorrow scheduled at 10:30 for Dr. Leone Payor.  Did discuss risks, benefits, limitations and alternatives with the patient and she agrees to proceed. 2.  Agree with Pantoprazole 40 twice daily and Carafate 4 times daily for now. 3.  Patient can remain on regular diet today and n.p.o. at midnight. 4.  Please await any further recommendations from Dr. Leone Payor later today.  Thank you for your kind consultation, we will continue to follow.  Violet Baldy Lemmon  07/14/2020, 1:43 PM   GI Attending  Extensive chest pain eval negative so far. EGD to exclude GI pathology that could be related.  The risks and benefits as well as alternatives of endoscopic procedure(s) have been discussed and reviewed. All questions answered. The patient agrees to proceed.  Iva Boop, MD, [facility] 07/14/2020 5:52 PM

## 2020-07-14 NOTE — Progress Notes (Signed)
PROGRESS NOTE    Kathy Velasquez  ALP:[id number] DOB: [birth year] DOA: 07/12/2020 PCP: Patient, No Pcp Per (Inactive)    Brief Narrative:  [name] was admitted to the hospital with the working diagnosis of chest pain.    25 year old female past medical history for GERD, generalized anxiety disorder nicotine dependence who presented with chest pain.  Reported severe precordial chest pain, burning/sharp in nature, intermittent, nonexertional or positional related.  Radiated to anterior chest wall.  Her blood pressure was 134/72, heart rate 72, respirate 21, oxygen saturation 95%, her lungs are clear to auscultation bilaterally, heart S1-S2, present, rhythmic, soft abdomen, no lower extremity edema.   Sodium 137, potassium 3.8, chloride 104, bicarb 25, glucose 94, BUN 16, creatinine 0.78, magnesium 1.8, high sensitive troponin 20-21-12, white count 6.2, hemoglobin 14.3, hematocrit 42.5, platelets 281. SARS COVID-19 negative.  Toxicology screen negative.   CT chest negative for pulm embolism. Chest radiograph no infiltrates.   EKG 93 bpm, normal axis, normal intervals, sinus rhythm, no ST segment or T wave changes.  Patient has ruled out for acute coronary syndrome. She continue to have severe pain despite antiacid therapy. Burning type chest pain.  Plan for inpatient consultation with GI for possible endoscopy.     Assessment & Plan:   Principal Problem:   Chest pain Active Problems:   Generalized anxiety disorder   GERD without esophagitis   Nicotine dependence, other tobacco product, uncomplicated    Atypical chest pain/ GERD. Patient has ruled out for acute coronary syndrome. Echocardiogram with no wall motion abnormalities and preserved LV systolic function.  Persistent severe burning type chest pain despite aggressive antiacid therapy. Plan to consult GI for possible inpatient endoscopic evaluation.    2. Depression and anxiety. On duloxetine. Tolerating well alprazolam  for anxiety.    3. Nicotine use. Continue with nicotine patch.    Patient continue to be at high risk for worsening chest pain   Status is: Observation  The patient remains OBS appropriate and will d/c before 2 midnights.  Dispo: The patient is from: Home              Anticipated d/c is to: Home              Patient currently is not medically stable to d/c.   Difficult to place patient No   DVT prophylaxis: Enoxaparin   Code Status:   full Family Communication:  No family at the bedside        Consultants:  GI     Subjective: Patient continue to have severe chest pain, burning in nature with no improvement with antiacids, no nausea or vomiting, no dyspnea.   Objective: Vitals:   07/13/20 2340 07/14/20 0434 07/14/20 0722 07/14/20 1139  BP: (!) 104/55 109/67 108/74 114/78  Pulse: 74 60 74 86  Resp: 20 16 18 19   Temp: (!) 97.5 F (36.4 C) 97.8 F (36.6 C) 97.8 F (36.6 C) 97.8 F (36.6 C)  TempSrc: Oral Oral Oral Oral  SpO2: 99% 100% 94% 95%    Intake/Output Summary (Last 24 hours) at 07/14/2020 1224 Last data filed at 07/14/2020 1200 Gross per 24 hour  Intake 720 ml  Output --  Net 720 ml   There were no vitals filed for this visit.  Examination:   General: Not in pain or dyspnea, deconditioned  Neurology: Awake and alert, non focal  E ENT: mild pallor, no icterus, oral mucosa moist Cardiovascular: No JVD. S1-S2 present, rhythmic, no gallops, rubs, or  murmurs. No lower extremity edema. Pulmonary: positive breath sounds bilaterally, adequate air movement, no wheezing, rhonchi or rales. Gastrointestinal. Abdomen soft and non tender Skin. No rashes Musculoskeletal: no joint deformities     Data Reviewed: I have personally reviewed following labs and imaging studies  CBC: Recent Labs  Lab 07/11/20 1315 07/12/20 1908  WBC 6.3 6.2  NEUTROABS 4.2 4.0  HGB 14.3 14.3  HCT 42.3 42.5  MCV 93.0 94.4  PLT 275 281   Basic Metabolic Panel: Recent  Labs  Lab 07/11/20 1315 07/12/20 1908 07/14/20 0113  NA 135 137 137  K 4.0 3.8 4.0  CL 103 104 104  CO2 23 25 23   GLUCOSE 99 94 89  BUN 15 16 21*  CREATININE 0.78 0.78 0.95  CALCIUM 9.3 9.1 8.7*  MG  --  1.8  --    GFR: CrCl cannot be calculated (Unknown ideal weight.). Liver Function Tests: Recent Labs  Lab 07/11/20 1315  AST 20  ALT 38  ALKPHOS 76  BILITOT 0.9  PROT 7.5  ALBUMIN 4.0   Recent Labs  Lab 07/11/20 1315  LIPASE 24   No results for input(s): AMMONIA in the last 168 hours. Coagulation Profile: No results for input(s): INR, PROTIME in the last 168 hours. Cardiac Enzymes: No results for input(s): CKTOTAL, CKMB, CKMBINDEX, TROPONINI in the last 168 hours. BNP (last 3 results) No results for input(s): PROBNP in the last 8760 hours. HbA1C: No results for input(s): HGBA1C in the last 72 hours. CBG: No results for input(s): GLUCAP in the last 168 hours. Lipid Profile: No results for input(s): CHOL, HDL, LDLCALC, TRIG, CHOLHDL, LDLDIRECT in the last 72 hours. Thyroid Function Tests: Recent Labs    07/12/20 1908  TSH 1.062   Anemia Panel: No results for input(s): VITAMINB12, FOLATE, FERRITIN, TIBC, IRON, RETICCTPCT in the last 72 hours.    Radiology Studies: I have reviewed all of the imaging during this hospital visit personally     Scheduled Meds:  DULoxetine  20 mg Oral Daily   enoxaparin (LOVENOX) injection  40 mg Subcutaneous Daily   pantoprazole  40 mg Oral BID   sucralfate  1 g Oral TID WC & HS   Continuous Infusions:   LOS: 0 days        Toby Ayad 07/14/20, MD

## 2020-07-14 NOTE — Plan of Care (Signed)

## 2020-07-14 NOTE — Consult Note (Addendum)
Consultation  Referring Provider:    Dr. Ella Jubilee Primary Care Physician:  Patient, No Pcp Per (Inactive) Primary Gastroenterologist:   Gentry Fitz      Reason for Consultation: Atypical chest pain            HPI:   Kathy Velasquez is a 25 y.o. female with a past medical history as listed below including anxiety and depression, GERD and nicotine dependence, who presented to the ER on 07/13/2018 with a complaint of chest discomfort.    At time of presentation patient described that since Friday, 07/11/2020 she had been experiencing chest discomfort which woke her from her sleep early that morning and a sense of "intense dread" the evening prior.  This is described as midsternal and burning but becoming more sharp and intensified.  Seemed to radiate diffusely throughout the anterior chest as well as occasionally rating to the jaw.  Apparently seen at the local urgent care clinic and told that she may be suffering from anxiety and was prescribed hydroxyzine.  It did not seem to help her symptoms.  Also described intermittent bouts of intense panic that she felt the panic attack.  Felt they were secondary to chest discomfort not the primary cause.    Today, the patient describes that she has a long history of generalized anxiety disorder and panic attacks, but on Friday morning woke up with "a feeling of dread", and later that day developed severe chest pain and tightness which seemed to be burning and radiating throughout her chest and out to the sides.  Tells me the night before she had dinner around 930 but this is typical for her as she works at [employer] and does not go to bed until 3 or 4 in the morning.  Had nothing strange to eat.  Explains that eating did not seem to exacerbate symptoms.  Apparently presented to the ER initially but had a initial triage and was told this was likely a panic attack, after waiting for 3 hours she decided to leave.  Continued with pain and went to the urgent care and  was told this was still anxiety and was given Hydroxyzine.  Took the Hydroxyzine but this did not seem to help.  Pain continued into yesterday rated as a 9-10/10, but "felt different than my normal anxiety", so she presented to the ER.  Troponins were elevated and she had cardiac work-up which was found to be negative.  Tells me she continues with a burning in her chest which seems to be radiating to the sides which hurts worse if she takes a deep breath.  She has been eating a regular diet which does not seem to exacerbate symptoms.  Also using Protonix 40 twice a day and Carafate 4 times daily with no real change.    Describes a history of IBS-D, apparently following with her PCP.  Was recently given the low FODMAP diet but has not had a chance to start this yet.  The symptoms have not changed recently.    Denies fever, chills, weight loss, blood in her stool, nausea, vomiting or use of NSAIDs.  ED course: EKG unremarkable, D-dimer normal, chest x-ray unremarkable, CT angiogram of chest revealed no evidence of pulmonary embolism, slightly elevated troponins, 20 and 21  Past Medical History:  Diagnosis Date   Anxiety    Depression    Hernia of abdominal cavity    PCOS (polycystic ovarian syndrome)    PTSD (post-traumatic stress disorder)  Umbilical hernia 07/01/2020    History reviewed. No pertinent surgical history.  Family History  Problem Relation Age of Onset   Cancer Mother      Social History   Tobacco Use   Smoking status: Never   Smokeless tobacco: Current  Vaping Use   Vaping Use: Every day  Substance Use Topics   Alcohol use: Yes   Drug use: Never    Prior to Admission medications   Medication Sig Start Date End Date Taking? Authorizing Provider  acetaminophen (TYLENOL) 325 MG tablet Take 650 mg by mouth every 6 (six) hours as needed for mild pain.   Yes [provider]  betamethasone dipropionate 0.05 % cream Apply 1 application topically 2 (two) times  daily as needed (psoriasis). 06/02/20  Yes [provider]  dicyclomine (BENTYL) 10 MG capsule Take 10 mg by mouth 2 (two) times daily as needed for spasms. 07/09/20  Yes [provider]  DULoxetine (CYMBALTA) 20 MG capsule Take 20 mg by mouth daily. 07/09/20  Yes [provider]  hydrOXYzine (ATARAX/VISTARIL) 25 MG tablet Take 1 tablet (25 mg total) by mouth every 6 (six) hours as needed for anxiety. 12/05/19  Yes Yu, Amy V, PA-C  Multiple Vitamin (MULTIVITAMIN ADULT PO) Take 1 tablet by mouth daily.   Yes [provider]  Vitamin D, Cholecalciferol, 10 MCG (400 UNIT) CAPS Take 800 Units by mouth daily.   Yes [provider]    Current Facility-Administered Medications  Medication Dose Route Frequency Provider Last Rate Last Admin   acetaminophen (TYLENOL) tablet 650 mg  650 mg Oral Q4H PRN Shalhoub, Deno Lunger, MD   650 mg at 07/13/20 1516   ALPRAZolam (XANAX) tablet 1 mg  1 mg Oral TID PRN Arrien, York Ram, MD       DULoxetine (CYMBALTA) DR capsule 20 mg  20 mg Oral Daily Shalhoub, Deno Lunger, MD   20 mg at 07/14/20 0911   enoxaparin (LOVENOX) injection 40 mg  40 mg Subcutaneous Daily Shalhoub, Deno Lunger, MD       nicotine (NICODERM CQ - dosed in mg/24 hours) patch 14 mg  14 mg Transdermal Daily Arrien, York Ram, MD       ondansetron [facility]) injection 4 mg  4 mg Intravenous Q6H PRN Shalhoub, Deno Lunger, MD       pantoprazole (PROTONIX) EC tablet 40 mg  40 mg Oral BID Arrien, York Ram, MD   40 mg at 07/14/20 0911   polyethylene glycol (MIRALAX / GLYCOLAX) packet 17 g  17 g Oral Daily PRN Shalhoub, Deno Lunger, MD       sucralfate (CARAFATE) 1 GM/10ML suspension 1 g  1 g Oral TID WC & HS Arrien, York Ram, MD   1 g at 07/14/20 1202    Allergies as of 07/12/2020   (No Known Allergies)     Review of Systems:    Constitutional: No weight loss, fever or chills Skin: No rash  Cardiovascular: +chest pain, chest pressure Respiratory:  No SOB Gastrointestinal: See HPI and otherwise negative Genitourinary: No dysuria Neurological: No headache, dizziness or syncope Musculoskeletal: No new muscle or joint pain Hematologic: No bleeding Psychiatric: +anxiety   Physical Exam:  Vital signs in last 24 hours: Temp:  [97.5 F (36.4 C)-98.3 F (36.8 C)] 97.8 F (36.6 C) (06/20 1139) Pulse Rate:  [60-86] 86 (06/20 1139) Resp:  [16-20] 19 (06/20 1139) BP: (97-114)/(55-79) 114/78 (06/20 1139) SpO2:  [94 %-100 %] 95 % (06/20 1139) Last BM  Date: 07/13/20 General:   Pleasant overweight Caucasian female appears to be in NAD, Well developed, Well nourished, alert and cooperative Head:  Normocephalic and atraumatic. Eyes:   PEERL, EOMI. No icterus. Conjunctiva pink. Ears:  Normal auditory acuity. Neck:  Supple Throat: Oral cavity and pharynx without inflammation, swelling or lesion.  Lungs: Respirations even and unlabored. Lungs clear to auscultation bilaterally.   No wheezes, crackles, or rhonchi.  Heart: Normal S1, S2. No MRG. Regular rate and rhythm. No peripheral edema, cyanosis or pallor.  Abdomen:  Soft, nondistended, nontender. No rebound or guarding. Normal bowel sounds. No appreciable masses or hepatomegaly. Rectal:  Not performed.  Msk:  Symmetrical without gross deformities. Peripheral pulses intact.  Extremities:  Without edema, no deformity or joint abnormality.  Neurologic:  Alert and  oriented x4;  grossly normal neurologically.  Skin:   Dry and intact without significant lesions or rashes. Psychiatric: Demonstrates good judgement and reason without abnormal affect or behaviors.   LAB RESULTS: Recent Labs    07/12/20 1908  WBC 6.2  HGB 14.3  HCT 42.5  PLT 281   BMET Recent Labs    07/12/20 1908 07/14/20 0113  NA 137 137  K 3.8 4.0  CL 104 104  CO2 25 23  GLUCOSE 94 89  BUN 16 21*  CREATININE 0.78 0.95  CALCIUM 9.1 8.7*    STUDIES: DG Chest 2 View  Result Date: 07/12/2020 CLINICAL DATA:   Chest pain. EXAM: CHEST - 2 VIEW COMPARISON:  July 11, 2020 FINDINGS: The heart size and mediastinal contours are within normal limits. Both lungs are clear. The visualized skeletal structures are unremarkable. IMPRESSION: No active cardiopulmonary disease. Electronically Signed   By: Ted Mcalpineobrinka  Dimitrova M.D.   On: 07/12/2020 19:22   CT Angio Chest PE W and/or Wo Contrast  Result Date: 07/13/2020 CLINICAL DATA:  Pulmonary embolus is suspected with high probability. Chest pain. EXAM: CT ANGIOGRAPHY CHEST WITH CONTRAST TECHNIQUE: Multidetector CT imaging of the chest was performed using the standard protocol during bolus administration of intravenous contrast. Multiplanar CT image reconstructions and MIPs were obtained to evaluate the vascular anatomy. CONTRAST:  100mL OMNIPAQUE IOHEXOL 350 MG/ML SOLN COMPARISON:  Chest radiograph 07/12/2020 FINDINGS: Cardiovascular: There is moderately good opacification of the central and segmental pulmonary arteries. No focal filling defects are demonstrated. No evidence of significant pulmonary embolus. Normal heart size. No pericardial effusions. Normal caliber thoracic aorta. Mediastinum/Nodes: No significant lymphadenopathy. Esophagus is decompressed. Lungs/Pleura: Lungs are clear. No pleural effusions. No pneumothorax. Upper Abdomen: No acute abnormalities demonstrated in the visualized upper abdomen. Musculoskeletal: No chest wall abnormality. No acute or significant osseous findings. Review of the MIP images confirms the above findings. IMPRESSION: No evidence of significant pulmonary embolus. No evidence of active pulmonary disease. Electronically Signed   By: Burman NievesWilliam  Stevens M.D.   On: 07/13/2020 01:16   ECHOCARDIOGRAM COMPLETE  Result Date: 07/13/2020    ECHOCARDIOGRAM REPORT   Patient Name:   Kathy Velasquez Date of Exam: 07/13/2020 Medical Rec #:  [id number]      Height:       65.0 in Accession #:    [id number]     Weight:       245.0 lb Date of Birth:  15-May-1995       BSA:          2.156 m Patient Age:    25 years       BP:           100/53 mmHg Patient  Gender: F              HR:           92 bpm. Exam Location:  Inpatient Procedure: 2D Echo Indications:    chest pain  History:        Patient has no prior history of Echocardiogram examinations.  Sonographer:    Delcie Roch Referring Phys: [id number] Deno Lunger SHALHOUB IMPRESSIONS  1. Left ventricular ejection fraction, by estimation, is 65 to 70%. The left ventricle has hyperdynamic function. The left ventricle has no regional wall motion abnormalities. Left ventricular diastolic parameters were normal.  2. Right ventricular systolic function is normal. The right ventricular size is normal. There is normal pulmonary artery systolic pressure.  3. The mitral valve is normal in structure. No evidence of mitral valve regurgitation. No evidence of mitral stenosis.  4. The aortic valve is tricuspid. Aortic valve regurgitation is not visualized. No aortic stenosis is present.  5. The inferior vena cava is normal in size with greater than 50% respiratory variability, suggesting right atrial pressure of 3 mmHg. FINDINGS  Left Ventricle: Left ventricular ejection fraction, by estimation, is 65 to 70%. The left ventricle has hyperdynamic function. The left ventricle has no regional wall motion abnormalities. The left ventricular internal cavity size was normal in size. There is no left ventricular hypertrophy. Left ventricular diastolic parameters were normal. Right Ventricle: The right ventricular size is normal. No increase in right ventricular wall thickness. Right ventricular systolic function is normal. There is normal pulmonary artery systolic pressure. The tricuspid regurgitant velocity is 2.31 m/s, and  with an assumed right atrial pressure of 3 mmHg, the estimated right ventricular systolic pressure is 24.3 mmHg. Left Atrium: Left atrial size was normal in size. Right Atrium: Right atrial size was normal in size.  Pericardium: There is no evidence of pericardial effusion. Mitral Valve: The mitral valve is normal in structure. No evidence of mitral valve regurgitation. No evidence of mitral valve stenosis. Tricuspid Valve: The tricuspid valve is normal in structure. Tricuspid valve regurgitation is not demonstrated. No evidence of tricuspid stenosis. Aortic Valve: The aortic valve is tricuspid. Aortic valve regurgitation is not visualized. No aortic stenosis is present. Pulmonic Valve: The pulmonic valve was not well visualized. Pulmonic valve regurgitation is not visualized. No evidence of pulmonic stenosis. Aorta: The aortic root is normal in size and structure. Venous: The inferior vena cava is normal in size with greater than 50% respiratory variability, suggesting right atrial pressure of 3 mmHg. IAS/Shunts: No atrial level shunt detected by color flow Doppler.  LEFT VENTRICLE PLAX 2D LVIDd:         4.50 cm  Diastology LVIDs:         2.50 cm  LV e' medial:    11.30 cm/s LV PW:         0.80 cm  LV E/e' medial:  7.6 LV IVS:        0.70 cm  LV e' lateral:   13.70 cm/s LVOT diam:     1.90 cm  LV E/e' lateral: 6.3 LV SV:         45 LV SV Index:   21 LVOT Area:     2.84 cm  RIGHT VENTRICLE RV S prime:     16.50 cm/s TAPSE (M-mode): 2.6 cm LEFT ATRIUM             Index       RIGHT ATRIUM  Index LA diam:        3.50 cm 1.62 cm/m  RA Area:     11.80 cm LA Vol (A2C):   36.5 ml 16.93 ml/m RA Volume:   24.50 ml  11.36 ml/m LA Vol (A4C):   36.3 ml 16.83 ml/m LA Biplane Vol: 38.5 ml 17.85 ml/m  AORTIC VALVE LVOT Vmax:   92.10 cm/s LVOT Vmean:  60.800 cm/s LVOT VTI:    0.158 m  AORTA Ao Root diam: 2.70 cm Ao Asc diam:  2.50 cm MITRAL VALVE               TRICUSPID VALVE MV Area (PHT): 3.65 cm    TR Peak grad:   21.3 mmHg MV Decel Time: 208 msec    TR Vmax:        231.00 cm/s MV E velocity: 86.10 cm/s MV A velocity: 61.70 cm/s  SHUNTS MV E/A ratio:  1.40        Systemic VTI:  0.16 m                            Systemic  Diam: 1.90 cm Dina Rich MD Electronically signed by Dina Rich MD Signature Date/Time: 07/13/2020/2:45:40 PM    Final      Impression / Plan:   Impression: 1.  Atypical chest pain: With initially minimally elevated troponins, normal echo and EKG, GI cocktail administered in the ER with no improvement, CT normal, been ruled out for acute coronary syndrome overnight but continues to have severe pain despite antacid therapy; consider anxiety+/- functional dyspepsia versus other 2.  GERD: New for the patient, describes a burning in her chest 3.  Nicotine dependence 4.  Generalized anxiety disorder  Plan: 1.  We will plan for EGD tomorrow scheduled at 10:30 for Dr. Leone Payor.  Did discuss risks, benefits, limitations and alternatives with the patient and she agrees to proceed. 2.  Agree with Pantoprazole 40 twice daily and Carafate 4 times daily for now. 3.  Patient can remain on regular diet today and n.p.o. at midnight. 4.  Please await any further recommendations from Dr. Leone Payor later today.  Thank you for your kind consultation, we will continue to follow.  Violet Baldy Lemmon  07/14/2020, 1:43 PM   GI Attending  Extensive chest pain eval negative so far. EGD to exclude GI pathology that could be related.  The risks and benefits as well as alternatives of endoscopic procedure(s) have been discussed and reviewed. All questions answered. The patient agrees to proceed.  Iva Boop, MD, [facility] 07/14/2020 5:52 PM

## 2020-07-15 ENCOUNTER — Observation Stay (HOSPITAL_COMMUNITY): Payer: Commercial Managed Care - PPO | Admitting: Anesthesiology

## 2020-07-15 ENCOUNTER — Encounter (HOSPITAL_COMMUNITY): Payer: Self-pay | Admitting: Internal Medicine

## 2020-07-15 ENCOUNTER — Encounter (HOSPITAL_COMMUNITY)
Admission: EM | Disposition: A | Discharge status: Home / Self Care | Payer: Self-pay | Source: Home / Self Care | Attending: Emergency Medicine

## 2020-07-15 DIAGNOSIS — R079 Chest pain, unspecified: Secondary | ICD-10-CM | POA: Diagnosis not present

## 2020-07-15 DIAGNOSIS — F17290 Nicotine dependence, other tobacco product, uncomplicated: Secondary | ICD-10-CM | POA: Diagnosis not present

## 2020-07-15 DIAGNOSIS — F411 Generalized anxiety disorder: Secondary | ICD-10-CM | POA: Diagnosis not present

## 2020-07-15 DIAGNOSIS — K219 Gastro-esophageal reflux disease without esophagitis: Secondary | ICD-10-CM | POA: Diagnosis not present

## 2020-07-15 HISTORY — PX: ESOPHAGOGASTRODUODENOSCOPY (EGD) WITH PROPOFOL: SHX5813

## 2020-07-15 SURGERY — ESOPHAGOGASTRODUODENOSCOPY (EGD) WITH PROPOFOL
Anesthesia: Monitor Anesthesia Care

## 2020-07-15 MED ORDER — DEXMEDETOMIDINE (PRECEDEX) IN NS 20 MCG/5ML (4 MCG/ML) IV SYRINGE
PREFILLED_SYRINGE | INTRAVENOUS | Status: DC | PRN
  Administered 2020-07-15: 8 ug via INTRAVENOUS

## 2020-07-15 MED ORDER — MIDAZOLAM HCL (PF) 5 MG/ML IJ SOLN
INTRAMUSCULAR | Status: AC
  Filled 2020-07-15: qty 1

## 2020-07-15 MED ORDER — MIDAZOLAM HCL (PF) 10 MG/2ML IJ SOLN
2.0000 mg | Freq: Once | INTRAMUSCULAR | Status: AC
  Administered 2020-07-15: 2 mg via INTRAVENOUS

## 2020-07-15 MED ORDER — PROPOFOL 10 MG/ML IV BOLUS
INTRAVENOUS | Status: DC | PRN
  Administered 2020-07-15 (×2): 20 mg via INTRAVENOUS

## 2020-07-15 MED ORDER — LAMOTRIGINE 25 MG PO TABS: 25.0000 mg | ORAL_TABLET | Freq: Every day | ORAL | 0 refills | Status: AC

## 2020-07-15 MED ORDER — LIDOCAINE 2% (20 MG/ML) 5 ML SYRINGE
INTRAMUSCULAR | Status: DC | PRN
  Administered 2020-07-15: 50 mg via INTRAVENOUS

## 2020-07-15 MED ORDER — PROPOFOL 500 MG/50ML IV EMUL
INTRAVENOUS | Status: DC | PRN
  Administered 2020-07-15: 150 ug/kg/min via INTRAVENOUS

## 2020-07-15 MED ORDER — LAMOTRIGINE 25 MG PO TABS
25.0000 mg | ORAL_TABLET | Freq: Every day | ORAL | Status: DC
  Administered 2020-07-15: 25 mg via ORAL
  Filled 2020-07-15: qty 1

## 2020-07-15 SURGICAL SUPPLY — 15 items

## 2020-07-15 NOTE — Op Note (Signed)
[facility] Patient Name: Kathy Velasquez Procedure Date : 07/15/2020 MRN: [id number] Attending MD: Iva Boop , MD Date of Birth: 02-20-1995 CSN: [id number] Age: 25 Admit Type: Inpatient Procedure:                Upper GI endoscopy Indications:              Unexplained chest pain Providers:                Iva Boop, MD, Margaree Mackintosh, RN,                            Michele Mcalpine Technician Referring MD:              Medicines:                Propofol per Anesthesia, Monitored Anesthesia Care Complications:            No immediate complications. Estimated Blood Loss:     Estimated blood loss: none. Procedure:                Pre-Anesthesia Assessment:                           - Prior to the procedure, a History and Physical                            was performed, and patient medications and                            allergies were reviewed. The patient's tolerance of                            previous anesthesia was also reviewed. The risks                            and benefits of the procedure and the sedation                            options and risks were discussed with the patient.                            All questions were answered, and informed consent                            was obtained. Prior Anticoagulants: The patient has                            taken no previous anticoagulant or antiplatelet                            agents. ASA Grade Assessment: II - A patient with                            mild systemic disease. After reviewing the risks  and benefits, the patient was deemed in                            satisfactory condition to undergo the procedure.                           After obtaining informed consent, the endoscope was                            passed under direct vision. Throughout the                            procedure, the patient's blood pressure, pulse, and                             oxygen saturations were monitored continuously. The                            GIF-H190 ([id number]) Olympus gastroscope was                            introduced through the mouth, and advanced to the                            second part of duodenum. The upper GI endoscopy was                            accomplished without difficulty. The patient                            tolerated the procedure well. Scope In: Scope Out: Findings:      The esophagus was normal.      The stomach was normal.      The examined duodenum was normal.      The cardia and gastric fundus were normal on retroflexion. Impression:               - Normal esophagus.                           - Normal stomach.                           - Normal examined duodenum.                           - No specimens collected. Recommendation:           - Patient has a contact number available for                            emergencies. The signs and symptoms of potential                            delayed complications were discussed with the  patient. Return to normal activities tomorrow.                            Written discharge instructions were provided to the                            patient.                           - Resume previous diet.                           - Continue present medications.                           - STOP PPI                           STOP CARAFATE                           CHEST PAIN IS NOT GI                           ? INCREASE DULOXETINE                           ? ADD TCA                           NO OUTPATIENT GI F/U NEEDED Procedure Code(s):        --- Professional ---                           [phone], Esophagogastroduodenoscopy, flexible,                            transoral; diagnostic, including collection of                            specimen(s) by brushing or washing, when performed                            (separate procedure) Diagnosis  Code(s):        --- Professional ---                           R07.9, Chest pain, unspecified CPT copyright 2019 [employer]. All rights reserved. The codes documented in this report are preliminary and upon coder review may  be revised to meet current compliance requirements. Iva Boop, MD 07/15/2020 10:53:35 AM This report has been signed electronically. Number of Addenda: 0

## 2020-07-15 NOTE — Plan of Care (Signed)

## 2020-07-15 NOTE — Interval H&P Note (Signed)
History and Physical Interval Note:  07/15/2020 9:57 AM  Kathy Velasquez  has presented today for surgery, with the diagnosis of Atypical chest pain.  The various methods of treatment have been discussed with the patient and family. After consideration of risks, benefits and other options for treatment, the patient has consented to  Procedure(s): ESOPHAGOGASTRODUODENOSCOPY (EGD) WITH PROPOFOL (N/A) as a surgical intervention.  The patient's history has been reviewed, patient examined, no change in status, stable for surgery.  I have reviewed the patient's chart and labs.  Questions were answered to the patient's satisfaction.     Stan Head

## 2020-07-15 NOTE — Transfer of Care (Signed)
Immediate Anesthesia Transfer of Care Note  Patient: Kathy Velasquez  Procedure(s) Performed: ESOPHAGOGASTRODUODENOSCOPY (EGD) WITH PROPOFOL  Patient Location: PACU  Anesthesia Type:MAC  Level of Consciousness: awake, alert  and oriented  Airway & Oxygen Therapy: Patient Spontanous Breathing  Post-op Assessment: Report given to RN, Post -op Vital signs reviewed and stable and Patient moving all extremities X 4  Post vital signs: Reviewed and stable  Last Vitals:  Vitals Value Taken Time  BP    Temp    Pulse 64 07/15/20 1039  Resp 17 07/15/20 1039  SpO2 96 % 07/15/20 1039  Vitals shown include unvalidated device data.  Last Pain:  Vitals:   07/15/20 0911  TempSrc:   PainSc: 2       Patients Stated Pain Goal: 0 (78/47/84 1282)  Complications: No notable events documented.

## 2020-07-15 NOTE — Discharge Summary (Signed)
Physician Discharge Summary  Kathy Velasquez WUJ:[id number] DOB: November 25, 1995 DOA: 07/12/2020  PCP: Patient, No Pcp Per (Inactive)  Admit date: 07/12/2020 Discharge date: 07/15/2020  Admitted From: Home  Disposition:  Home   Recommendations for Outpatient Follow-up and new medication changes:  Follow up with Primary Care in 7 to 10 days.  Patient will continue taking duloxetine and lamotrigine has been resumed at 25 mg daily.  Follow up with psychiatry as outpatient.   Home Health: no   Equipment/Devices: no    Discharge Condition: stable  CODE STATUS: full  Diet recommendation:  regular   Brief/Interim Summary: Kathy Velasquez was admitted to the hospital with the working diagnosis of chest pain.    25 year old female past medical history for GERD, generalized anxiety disorder nicotine dependence who presented with chest pain.  Reported severe precordial chest pain, burning/sharp in nature, intermittent, nonexertional or positional related.  Radiated to anterior chest wall.  Her blood pressure was 134/72, heart rate 72, respiratory rate 21, oxygen saturation 95%, her lungs were clear to auscultation bilaterally, heart S1-S2, present, rhythmic, soft abdomen, no lower extremity edema.   Sodium 137, potassium 3.8, chloride 104, bicarb 25, glucose 94, BUN 16, creatinine 0.78, magnesium 1.8, high sensitive troponin 20-21-12, white count 6.2, hemoglobin 14.3, hematocrit 42.5, platelets 281. SARS COVID-19 negative.  Toxicology screen negative.   CT chest negative for pulm embolism. Chest radiograph no infiltrates.   EKG 93 bpm, normal axis, normal intervals, sinus rhythm, no ST segment or T wave changes.   Patient has ruled out for acute coronary syndrome. She continue to have severe pain despite antiacid therapy. Burning type chest pain.   Underwent EGD with was negative for peptic ulcer disease or esophagitis.   Chest pain possibly anxiety related, can not rule out esophageal spasms.    Resumed lamotrigine and instructions to follow up with psychiatry as outpatient.   Atypical chest pain, possible esophageal spasm, anxiety related.   Patient was admitted to the medical ward, further work-up included echocardiogram which showed a preserved LV systolic function, no wall motion abnormalities.  She ruled out for acute coronary syndrome.  Due to her persistent burning type pain gastroenterology was consulted and patient underwent upper endoscopy which was normal.  2.  Depression and anxiety.  Now that life-threatening conditions have been ruled out including acute coronary syndrome, pulmonary embolism and severe peptic ulcer disease likely her symptoms are anxiety related.  Possible esophageal spasms, but it cannot be confirmed.   Recently she has been suffering from increase psychological stress at home.  A few years ago she was on lamotrigine and duloxetine combination with good toleration and adequate anxiety control.  Unfortunately she lost insurance and therapy was discontinued.  Now that she has insurance again, she has resumed duloxetine but not yet lamotrigine. Will plan to resume lamotrigine low-dose 25 mg daily and have close follow-up as an outpatient, she will follow-up as well with psychiatry.  3.  Nicotine abuse, patient was placed on nicotine patch during her hospitalization.  Smoking cessation counseling.  4.  Obesity class 3.  Calculated BMI 41.4.  Follow-up as an outpatient.   Discharge Diagnoses:  Principal Problem:   Chest pain Active Problems:   Generalized anxiety disorder   GERD without esophagitis   Nicotine dependence, other tobacco product, uncomplicated    Discharge Instructions   Allergies as of 07/15/2020   No Known Allergies      Medication List     STOP taking these medications  hydrOXYzine 25 MG tablet Commonly known as: ATARAX/VISTARIL       TAKE these medications    acetaminophen 325 MG tablet Commonly known as:  TYLENOL Take 650 mg by mouth every 6 (six) hours as needed for mild pain.   betamethasone dipropionate 0.05 % cream Apply 1 application topically 2 (two) times daily as needed (psoriasis).   dicyclomine 10 MG capsule Commonly known as: BENTYL Take 10 mg by mouth 2 (two) times daily as needed for spasms.   DULoxetine 20 MG capsule Commonly known as: CYMBALTA Take 20 mg by mouth daily.   lamoTRIgine 25 MG tablet Commonly known as: LAMICTAL Take 1 tablet (25 mg total) by mouth daily.   MULTIVITAMIN ADULT PO Take 1 tablet by mouth daily.   Vitamin D (Cholecalciferol) 10 MCG (400 UNIT) Caps Take 800 Units by mouth daily.        No Known Allergies  Consultations: GI    Procedures/Studies: DG Chest 2 View  Result Date: 07/12/2020 CLINICAL DATA:  Chest pain. EXAM: CHEST - 2 VIEW COMPARISON:  July 11, 2020 FINDINGS: The heart size and mediastinal contours are within normal limits. Both lungs are clear. The visualized skeletal structures are unremarkable. IMPRESSION: No active cardiopulmonary disease. Electronically Signed   By: Ted Mcalpine M.D.   On: 07/12/2020 19:22   DG Chest 2 View  Result Date: 07/11/2020 CLINICAL DATA:  Chest pain and dizziness EXAM: CHEST - 2 VIEW COMPARISON:  November 18, 2019 FINDINGS: Lungs are clear. Heart size and pulmonary vascularity are normal. No adenopathy. No pneumothorax. No bone lesions. IMPRESSION: Lungs clear.  Cardiac silhouette within normal limits. Electronically Signed   By: Bretta Bang III M.D.   On: 07/11/2020 14:12   CT Angio Chest PE W and/or Wo Contrast  Result Date: 07/13/2020 CLINICAL DATA:  Pulmonary embolus is suspected with high probability. Chest pain. EXAM: CT ANGIOGRAPHY CHEST WITH CONTRAST TECHNIQUE: Multidetector CT imaging of the chest was performed using the standard protocol during bolus administration of intravenous contrast. Multiplanar CT image reconstructions and MIPs were obtained to evaluate the  vascular anatomy. CONTRAST:  OMNIPAQUE IOHEXOL 350 MG/ML SOLN COMPARISON:  Chest radiograph 07/12/2020 FINDINGS: Cardiovascular: There is moderately good opacification of the central and segmental pulmonary arteries. No focal filling defects are demonstrated. No evidence of significant pulmonary embolus. Normal heart size. No pericardial effusions. Normal caliber thoracic aorta. Mediastinum/Nodes: No significant lymphadenopathy. Esophagus is decompressed. Lungs/Pleura: Lungs are clear. No pleural effusions. No pneumothorax. Upper Abdomen: No acute abnormalities demonstrated in the visualized upper abdomen. Musculoskeletal: No chest wall abnormality. No acute or significant osseous findings. Review of the MIP images confirms the above findings. IMPRESSION: No evidence of significant pulmonary embolus. No evidence of active pulmonary disease. Electronically Signed   By: Burman Nieves M.D.   On: 07/13/2020 01:16   ECHOCARDIOGRAM COMPLETE  Result Date: 07/13/2020    ECHOCARDIOGRAM REPORT   Patient Name:   ADIANA SMELCER Date of Exam: 07/13/2020 Medical Rec #:  [id number]      Height:       65.0 in Accession #:    [id number]     Weight:       245.0 lb Date of Birth:  04-07-1995      BSA:          2.156 m Patient Age:    25 years       BP:           100/53 mmHg Patient Gender: F  HR:           92 bpm. Exam Location:  Inpatient Procedure: 2D Echo Indications:    chest pain  History:        Patient has no prior history of Echocardiogram examinations.  Sonographer:    Delcie Roch Referring Phys: [id number] Deno Lunger SHALHOUB IMPRESSIONS  1. Left ventricular ejection fraction, by estimation, is 65 to 70%. The left ventricle has hyperdynamic function. The left ventricle has no regional wall motion abnormalities. Left ventricular diastolic parameters were normal.  2. Right ventricular systolic function is normal. The right ventricular size is normal. There is normal pulmonary artery systolic pressure.   3. The mitral valve is normal in structure. No evidence of mitral valve regurgitation. No evidence of mitral stenosis.  4. The aortic valve is tricuspid. Aortic valve regurgitation is not visualized. No aortic stenosis is present.  5. The inferior vena cava is normal in size with greater than 50% respiratory variability, suggesting right atrial pressure of 3 mmHg. FINDINGS  Left Ventricle: Left ventricular ejection fraction, by estimation, is 65 to 70%. The left ventricle has hyperdynamic function. The left ventricle has no regional wall motion abnormalities. The left ventricular internal cavity size was normal in size. There is no left ventricular hypertrophy. Left ventricular diastolic parameters were normal. Right Ventricle: The right ventricular size is normal. No increase in right ventricular wall thickness. Right ventricular systolic function is normal. There is normal pulmonary artery systolic pressure. The tricuspid regurgitant velocity is 2.31 m/s, and  with an assumed right atrial pressure of 3 mmHg, the estimated right ventricular systolic pressure is 24.3 mmHg. Left Atrium: Left atrial size was normal in size. Right Atrium: Right atrial size was normal in size. Pericardium: There is no evidence of pericardial effusion. Mitral Valve: The mitral valve is normal in structure. No evidence of mitral valve regurgitation. No evidence of mitral valve stenosis. Tricuspid Valve: The tricuspid valve is normal in structure. Tricuspid valve regurgitation is not demonstrated. No evidence of tricuspid stenosis. Aortic Valve: The aortic valve is tricuspid. Aortic valve regurgitation is not visualized. No aortic stenosis is present. Pulmonic Valve: The pulmonic valve was not well visualized. Pulmonic valve regurgitation is not visualized. No evidence of pulmonic stenosis. Aorta: The aortic root is normal in size and structure. Venous: The inferior vena cava is normal in size with greater than 50% respiratory variability,  suggesting right atrial pressure of 3 mmHg. IAS/Shunts: No atrial level shunt detected by color flow Doppler.  LEFT VENTRICLE PLAX 2D LVIDd:         4.50 cm  Diastology LVIDs:         2.50 cm  LV e' medial:    11.30 cm/s LV PW:         0.80 cm  LV E/e' medial:  7.6 LV IVS:        0.70 cm  LV e' lateral:   13.70 cm/s LVOT diam:     1.90 cm  LV E/e' lateral: 6.3 LV SV:         45 LV SV Index:   21 LVOT Area:     2.84 cm  RIGHT VENTRICLE RV S prime:     16.50 cm/s TAPSE (M-mode): 2.6 cm LEFT ATRIUM             Index       RIGHT ATRIUM           Index LA diam:        3.50  cm 1.62 cm/m  RA Area:     11.80 cm LA Vol (A2C):   36.5 ml 16.93 ml/m RA Volume:   24.50 ml  11.36 ml/m LA Vol (A4C):   36.3 ml 16.83 ml/m LA Biplane Vol: 38.5 ml 17.85 ml/m  AORTIC VALVE LVOT Vmax:   92.10 cm/s LVOT Vmean:  60.800 cm/s LVOT VTI:    0.158 m  AORTA Ao Root diam: 2.70 cm Ao Asc diam:  2.50 cm MITRAL VALVE               TRICUSPID VALVE MV Area (PHT): 3.65 cm    TR Peak grad:   21.3 mmHg MV Decel Time: 208 msec    TR Vmax:        231.00 cm/s MV E velocity: 86.10 cm/s MV A velocity: 61.70 cm/s  SHUNTS MV E/A ratio:  1.40        Systemic VTI:  0.16 m                            Systemic Diam: 1.90 cm Dina Rich MD Electronically signed by Dina Rich MD Signature Date/Time: 07/13/2020/2:45:40 PM    Final      Procedures: EGD   Subjective: Patient is feeling better, her chest pain is down to 1 and 3 /10 with no nausea or vomiting, she is tolerating po well.   Discharge Exam: Vitals:   07/15/20 1125 07/15/20 1158  BP: 103/80 110/81  Pulse: 62 61  Resp: 15 12  Temp: 98 F (36.7 C) 97.9 F (36.6 C)  SpO2: 95% 95%   Vitals:   07/15/20 1048 07/15/20 1100 07/15/20 1125 07/15/20 1158  BP: (!) 108/45 (!) 115/47 103/80 110/81  Pulse: 66 (!) 55 62 61  Resp: 20 17 15 12   Temp: 98 F (36.7 C)  98 F (36.7 C) 97.9 F (36.6 C)  TempSrc: Oral  Oral Oral  SpO2: 97% 97% 95% 95%  Weight:      Height:         General: Not in pain or dyspnea.  Neurology: Awake and alert, non focal  E ENT: with no pallor, no icterus, oral mucosa moist Cardiovascular: No JVD. S1-S2 present, rhythmic, no gallops, rubs, or murmurs. No lower extremity edema. Pulmonary: positive breath sounds bilaterally, adequate air movement, no wheezing, rhonchi or rales. Gastrointestinal. Abdomen soft and non tender Skin. No rashes Musculoskeletal: no joint deformities   The results of significant diagnostics from this hospitalization (including imaging, microbiology, ancillary and laboratory) are listed below for reference.     Microbiology: Recent Results (from the past 240 hour(s))  Resp Panel by RT-PCR (Flu A&B, Covid) Nasopharyngeal Swab     Status: None   Collection Time: 07/13/20  1:55 AM   Specimen: Nasopharyngeal Swab; Nasopharyngeal(NP) swabs in vial transport medium  Result Value Ref Range Status   SARS Coronavirus 2 by RT PCR NEGATIVE NEGATIVE Final    Comment: (NOTE) SARS-CoV-2 target nucleic acids are NOT DETECTED.  The SARS-CoV-2 RNA is generally detectable in upper respiratory specimens during the acute phase of infection. The lowest concentration of SARS-CoV-2 viral copies this assay can detect is 138 copies/mL. A negative result does not preclude SARS-Cov-2 infection and should not be used as the sole basis for treatment or other patient management decisions. A negative result may occur with  improper specimen collection/handling, submission of specimen other than nasopharyngeal swab, presence of viral mutation(s) within the areas targeted by  this assay, and inadequate number of viral copies(<138 copies/mL). A negative result must be combined with clinical observations, patient history, and epidemiological information. The expected result is Negative.  Fact Sheet for Patients:  BloggerCourse.comhttps://www.fda.gov/media/152166/download  Fact Sheet for Healthcare Providers:   SeriousBroker.ithttps://www.fda.gov/media/152162/download  This test is no t yet approved or cleared by the [place] FDA and  has been authorized for detection and/or diagnosis of SARS-CoV-2 by FDA under an Emergency Use Authorization (EUA). This EUA will remain  in effect (meaning this test can be used) for the duration of the COVID-19 declaration under Section 564(b)(1) of the Act, 21 U.S.C.section 360bbb-3(b)(1), unless the authorization is terminated  or revoked sooner.       Influenza A by PCR NEGATIVE NEGATIVE Final   Influenza B by PCR NEGATIVE NEGATIVE Final    Comment: (NOTE) The Xpert Xpress SARS-CoV-2/FLU/RSV plus assay is intended as an aid in the diagnosis of influenza from Nasopharyngeal swab specimens and should not be used as a sole basis for treatment. Nasal washings and aspirates are unacceptable for Xpert Xpress SARS-CoV-2/FLU/RSV testing.  Fact Sheet for Patients: BloggerCourse.comhttps://www.fda.gov/media/152166/download  Fact Sheet for Healthcare Providers: SeriousBroker.ithttps://www.fda.gov/media/152162/download  This test is not yet approved or cleared by the [place] FDA and has been authorized for detection and/or diagnosis of SARS-CoV-2 by FDA under an Emergency Use Authorization (EUA). This EUA will remain in effect (meaning this test can be used) for the duration of the COVID-19 declaration under Section 564(b)(1) of the Act, 21 U.S.C. section 360bbb-3(b)(1), unless the authorization is terminated or revoked.  Performed at [facility], [address] [place], [place] [address]      Labs: BNP (last 3 results) No results for input(s): BNP in the last 8760 hours. Basic Metabolic Panel: Recent Labs  Lab 07/11/20 1315 07/12/20 1908 07/14/20 0113  NA 135 137 137  K 4.0 3.8 4.0  CL 103 104 104  CO2 23 25 23   GLUCOSE 99 94 89  BUN 15 16 21*  CREATININE 0.78 0.78 0.95  CALCIUM 9.3 9.1 8.7*  MG  --  1.8  --    Liver Function Tests: Recent Labs  Lab 07/11/20 1315   AST 20  ALT 38  ALKPHOS 76  BILITOT 0.9  PROT 7.5  ALBUMIN 4.0   Recent Labs  Lab 07/11/20 1315  LIPASE 24   No results for input(s): AMMONIA in the last 168 hours. CBC: Recent Labs  Lab 07/11/20 1315 07/12/20 1908  WBC 6.3 6.2  NEUTROABS 4.2 4.0  HGB 14.3 14.3  HCT 42.3 42.5  MCV 93.0 94.4  PLT 275 281   Cardiac Enzymes: No results for input(s): CKTOTAL, CKMB, CKMBINDEX, TROPONINI in the last 168 hours. BNP: Invalid input(s): POCBNP CBG: No results for input(s): GLUCAP in the last 168 hours. D-Dimer Recent Labs    07/12/20 2259  DDIMER 0.44   Hgb A1c No results for input(s): HGBA1C in the last 72 hours. Lipid Profile No results for input(s): CHOL, HDL, LDLCALC, TRIG, CHOLHDL, LDLDIRECT in the last 72 hours. Thyroid function studies Recent Labs    07/12/20 1908  TSH 1.062   Anemia work up No results for input(s): VITAMINB12, FOLATE, FERRITIN, TIBC, IRON, RETICCTPCT in the last 72 hours. Urinalysis    Component Value Date/Time   COLORURINE YELLOW 05/16/2020 1745   APPEARANCEUR CLEAR 05/16/2020 1745   LABSPEC 1.018 05/16/2020 1745   PHURINE 5.0 05/16/2020 1745   GLUCOSEU NEGATIVE 05/16/2020 1745   HGBUR NEGATIVE 05/16/2020 1745  BILIRUBINUR NEGATIVE 05/16/2020 1745   KETONESUR NEGATIVE 05/16/2020 1745   PROTEINUR NEGATIVE 05/16/2020 1745   NITRITE NEGATIVE 05/16/2020 1745   LEUKOCYTESUR NEGATIVE 05/16/2020 1745   Sepsis Labs Invalid input(s): PROCALCITONIN,  WBC,  LACTICIDVEN Microbiology Recent Results (from the past 240 hour(s))  Resp Panel by RT-PCR (Flu A&B, Covid) Nasopharyngeal Swab     Status: None   Collection Time: 07/13/20  1:55 AM   Specimen: Nasopharyngeal Swab; Nasopharyngeal(NP) swabs in vial transport medium  Result Value Ref Range Status   SARS Coronavirus 2 by RT PCR NEGATIVE NEGATIVE Final    Comment: (NOTE) SARS-CoV-2 target nucleic acids are NOT DETECTED.  The SARS-CoV-2 RNA is generally detectable in upper  respiratory specimens during the acute phase of infection. The lowest concentration of SARS-CoV-2 viral copies this assay can detect is 138 copies/mL. A negative result does not preclude SARS-Cov-2 infection and should not be used as the sole basis for treatment or other patient management decisions. A negative result may occur with  improper specimen collection/handling, submission of specimen other than nasopharyngeal swab, presence of viral mutation(s) within the areas targeted by this assay, and inadequate number of viral copies(<138 copies/mL). A negative result must be combined with clinical observations, patient history, and epidemiological information. The expected result is Negative.  Fact Sheet for Patients:  BloggerCourse.com  Fact Sheet for Healthcare Providers:  SeriousBroker.it  This test is no t yet approved or cleared by the [place] FDA and  has been authorized for detection and/or diagnosis of SARS-CoV-2 by FDA under an Emergency Use Authorization (EUA). This EUA will remain  in effect (meaning this test can be used) for the duration of the COVID-19 declaration under Section 564(b)(1) of the Act, 21 U.S.C.section 360bbb-3(b)(1), unless the authorization is terminated  or revoked sooner.       Influenza A by PCR NEGATIVE NEGATIVE Final   Influenza B by PCR NEGATIVE NEGATIVE Final    Comment: (NOTE) The Xpert Xpress SARS-CoV-2/FLU/RSV plus assay is intended as an aid in the diagnosis of influenza from Nasopharyngeal swab specimens and should not be used as a sole basis for treatment. Nasal washings and aspirates are unacceptable for Xpert Xpress SARS-CoV-2/FLU/RSV testing.  Fact Sheet for Patients: BloggerCourse.com  Fact Sheet for Healthcare Providers: SeriousBroker.it  This test is not yet approved or cleared by the [place] FDA and has been  authorized for detection and/or diagnosis of SARS-CoV-2 by FDA under an Emergency Use Authorization (EUA). This EUA will remain in effect (meaning this test can be used) for the duration of the COVID-19 declaration under Section 564(b)(1) of the Act, 21 U.S.C. section 360bbb-3(b)(1), unless the authorization is terminated or revoked.  Performed at [facility], [address] [place], [place] [address]      Time coordinating discharge: 45 minutes  SIGNED:   Coralie Keens, MD  [facility] 07/15/2020, 12:50 PM

## 2020-07-15 NOTE — Progress Notes (Signed)
Pt to Endo with transport personnel.

## 2020-07-15 NOTE — Anesthesia Postprocedure Evaluation (Signed)
Anesthesia Post Note  Patient: Neyra Pettie  Procedure(s) Performed: ESOPHAGOGASTRODUODENOSCOPY (EGD) WITH PROPOFOL     Patient location during evaluation: PACU Anesthesia Type: MAC Level of consciousness: awake and alert Pain management: pain level controlled Vital Signs Assessment: post-procedure vital signs reviewed and stable Respiratory status: spontaneous breathing, nonlabored ventilation, respiratory function stable and patient connected to nasal cannula oxygen Cardiovascular status: stable and blood pressure returned to baseline Postop Assessment: no apparent nausea or vomiting Anesthetic complications: no   No notable events documented.  Last Vitals:  Vitals:   07/15/20 1048 07/15/20 1100  BP: (!) 108/45 (!) 115/47  Pulse: 66 (!) 55  Resp: 20 17  Temp: 36.7 C   SpO2: 97% 97%    Last Pain:  Vitals:   07/15/20 1048  TempSrc: Oral  PainSc: 0-No pain                 Anntoinette Haefele

## 2020-07-15 NOTE — Anesthesia Procedure Notes (Signed)
Procedure Name: MAC Date/Time: 07/15/2020 10:17 AM Performed by: Mariea Clonts, CRNA Pre-anesthesia Checklist: Patient identified, Emergency Drugs available, Suction available, Patient being monitored and Timeout performed Patient Re-evaluated:Patient Re-evaluated prior to induction Oxygen Delivery Method: Nasal cannula and Simple face mask Preoxygenation: Pre-oxygenation with 100% oxygen

## 2020-07-15 NOTE — Anesthesia Preprocedure Evaluation (Addendum)
Anesthesia Evaluation  Patient identified by MRN, date of birth, ID band Patient awake    Reviewed: Allergy & Precautions, H&P , NPO status , Patient's Chart, lab work & pertinent test results, reviewed documented beta blocker date and time   Airway Mallampati: II  TM Distance: >3 FB Neck ROM: full    Dental no notable dental hx. (+) Teeth Intact, Dental Advisory Given,    Pulmonary neg pulmonary ROS, Patient abstained from smoking.,    Pulmonary exam normal breath sounds clear to auscultation       Cardiovascular Exercise Tolerance: Good negative cardio ROS   Rhythm:regular Rate:Normal  ECHO 6/22 1. Left ventricular ejection fraction, by estimation, is 65 to 70%. The  left ventricle has hyperdynamic function. The left ventricle has no  regional wall motion abnormalities. Left ventricular diastolic parameters  were normal.  2. Right ventricular systolic function is normal. The right ventricular  size is normal. There is normal pulmonary artery systolic pressure.  3. The mitral valve is normal in structure. No evidence of mitral valve  regurgitation. No evidence of mitral stenosis.  4. The aortic valve is tricuspid. Aortic valve regurgitation is not  visualized. No aortic stenosis is present.  5. The inferior vena cava is normal in size with greater than 50%  respiratory variability, suggesting right atrial pressure of 3 mmHg.    Neuro/Psych PSYCHIATRIC DISORDERS Anxiety Depression negative neurological ROS     GI/Hepatic Neg liver ROS, GERD  Medicated,  Endo/Other  Morbid obesity  Renal/GU negative Renal ROS  negative genitourinary   Musculoskeletal   Abdominal   Peds  Hematology negative hematology ROS (+)   Anesthesia Other Findings   Reproductive/Obstetrics negative OB ROS                           Anesthesia Physical Anesthesia Plan  ASA: 2  Anesthesia Plan: MAC   Post-op  Pain Management:    Induction: Intravenous  PONV Risk Score and Plan: 2 and Treatment may vary due to age or medical condition  Airway Management Planned: Mask, Natural Airway and Simple Face Mask  Additional Equipment: None  Intra-op Plan:   Post-operative Plan:   Informed Consent: I have reviewed the patients History and Physical, chart, labs and discussed the procedure including the risks, benefits and alternatives for the proposed anesthesia with the patient or authorized representative who has indicated his/her understanding and acceptance.     Dental Advisory Given  Plan Discussed with: CRNA and Anesthesiologist  Anesthesia Plan Comments:        Anesthesia Quick Evaluation

## 2020-07-17 ENCOUNTER — Encounter (HOSPITAL_COMMUNITY): Payer: Self-pay | Admitting: Internal Medicine

## 2021-02-12 ENCOUNTER — Other Ambulatory Visit: Payer: Self-pay

## 2021-02-12 ENCOUNTER — Emergency Department
Admission: EM | Admit: 2021-02-12 | Discharge: 2021-02-12 | Disposition: A | Discharge status: Home / Self Care | Payer: BLUE CROSS/BLUE SHIELD | Attending: Emergency Medicine | Admitting: Emergency Medicine

## 2021-02-12 ENCOUNTER — Emergency Department: Payer: BLUE CROSS/BLUE SHIELD

## 2021-02-12 DIAGNOSIS — R079 Chest pain, unspecified: Secondary | ICD-10-CM | POA: Diagnosis not present

## 2021-02-12 LAB — CBC
HCT: 37.2 % (ref 36.0–46.0)
Hemoglobin: 12.7 g/dL (ref 12.0–15.0)
MCH: 32.6 pg (ref 26.0–34.0)
MCHC: 34.1 g/dL (ref 30.0–36.0)
MCV: 95.4 fL (ref 80.0–100.0)
Platelets: 246 10*3/uL (ref 150–400)
RBC: 3.9 MIL/uL (ref 3.87–5.11)
RDW: 12.9 % (ref 11.5–15.5)
WBC: 8 10*3/uL (ref 4.0–10.5)
nRBC: 0 % (ref 0.0–0.2)

## 2021-02-12 LAB — BASIC METABOLIC PANEL
Anion gap: 7 (ref 5–15)
BUN: 18 mg/dL (ref 6–20)
CO2: 24 mmol/L (ref 22–32)
Calcium: 9 mg/dL (ref 8.9–10.3)
Chloride: 103 mmol/L (ref 98–111)
Creatinine, Ser: 0.68 mg/dL (ref 0.44–1.00)
GFR, Estimated: >60 mL/min (ref >60)
Glucose, Bld: 108 mg/dL — ABNORMAL HIGH (ref 70–99)
Potassium: 3.5 mmol/L (ref 3.5–5.1)
Sodium: 134 mmol/L — ABNORMAL LOW (ref 135–145)

## 2021-02-12 LAB — TROPONIN I (HIGH SENSITIVITY)
Troponin I (High Sensitivity): <2 ng/L (ref <18)
Troponin I (High Sensitivity): <2 ng/L (ref <18)

## 2021-02-12 NOTE — ED Triage Notes (Signed)
Pt to ED for chest pain that started at work this afternoon. States this has happened before but not sure why.  Denies n/v States shob earlier but has since resolved.  Ambulatory, NAD noted

## 2021-02-12 NOTE — Discharge Instructions (Signed)
Your exam, labs including a troponin, as well as chest x-ray and EKG are reassuring at this time.  No signs of any serious malignant arrhythmias or indication for heart muscle damage.  He should follow with a primary provider and cardiologist as planned.  Return to the ED if needed.

## 2021-02-12 NOTE — ED Notes (Signed)
Pt to ED for CP, states its a squeezing pain L side of chest. States she took her VS at work and states "SPO2 was 92% and dropping, and HR was 117."  Denies SOB currently, but was experiencing it during the CP episode.  Denies Cardiac Hx.  Pt is A&ox4.

## 2021-02-12 NOTE — ED Provider Notes (Signed)
[facility] Provider Note  Patient Contact: 6:37 PM (approximate)   History   Chest Pain   HPI  Kathy Velasquez is a 26 y.o. female with a history of chest pain, PCOS, anxiety, anger, presents to the ED for evaluation of onset of chest pain that began at work this afternoon.  Patient reports about 2 hours of persistent left-sided sternal chest pain, similar to previous episodes of chest pain.  She denies any associated nausea vomiting.  She denies any diaphoresis, syncope, cough, or shortness of breath.  She was at work sitting at her workstation and doing some type at work when the symptoms began.  She reports waking up at 6 AM in her normal state of health and wellbeing and denies any preceding or aggravating factors.  She presents her self to the ED given history of similar episodes in the past, with varying onset and duration.  She has been evaluated by cardiology in the past, and reports a normal echo.  She does give a remote history of an acute pericarditis in September.     Physical Exam   Triage Vital Signs: ED Triage Vitals [02/12/21 1407]  Enc Vitals Group     BP 135/87     Pulse Rate 90     Resp 18     Temp 97.7 F (36.5 C)     Temp src      SpO2 97 %     Weight 250 lb (113.4 kg)     Height 5\' 5"  (1.651 m)     Head Circumference      Peak Flow      Pain Score 4     Pain Loc      Pain Edu?      Excl. in GC?     Most recent vital signs: Vitals:   02/12/21 1559 02/12/21 1801  BP: 110/74 (!) 135/93  Pulse: 82 73  Resp: 20 20  Temp:    SpO2: 97% 95%     General: Alert and in no acute distress. Cardiovascular: RRR.  No murmurs, rubs, or gallops.  Good peripheral perfusion Respiratory: Normal respiratory effort without tachypnea or retractions. Lungs CTAB.  Gastrointestinal: Bowel sounds 4 quadrants. Soft and nontender to palpation. No guarding or rigidity. No palpable masses. No distention. No CVA tenderness. Musculoskeletal: Full  range of motion to all extremities.  Neurologic:  No gross focal neurologic deficits are appreciated.  Skin:   No rash noted Other:   ED Results / Procedures / Treatments   Labs (all labs ordered are listed, but only abnormal results are displayed) Labs Reviewed  BASIC METABOLIC PANEL - Abnormal; Notable for the following components:      Result Value   Sodium 134 (*)    Glucose, Bld 108 (*)    All other components within normal limits  CBC  POC URINE PREG, ED  TROPONIN I (HIGH SENSITIVITY)  TROPONIN I (HIGH SENSITIVITY)     EKG  Vent. rate 81 BPM PR interval 138 ms QRS duration 82 ms QT/QTcB 368/427 ms P-R-T axes 71 84 52 Normal sinus rhythm with sinus arrhythmia No STEMI Normal intervals   RADIOLOGY  I personally viewed and evaluated these images as part of my medical decision making, as well as reviewing the written report by the radiologist.  ED Provider Interpretation: NAD  DG Chest 2 View  Result Date: 02/12/2021 CLINICAL DATA:  Chest pain, shortness of breath EXAM: CHEST - 2 VIEW COMPARISON:  07/12/2020 FINDINGS: Cardiac and mediastinal contours are within normal limits. No focal pulmonary opacity. No pleural effusion or pneumothorax. No acute osseous abnormality. IMPRESSION: No acute cardiopulmonary process. Electronically Signed   By: Merilyn Baba M.D.   On: 02/12/2021 14:30    PROCEDURES:  Critical Care performed: No  Procedures   MEDICATIONS ORDERED IN ED: Medications - No data to display   IMPRESSION / MDM / [place] / ED COURSE  I reviewed the triage vital signs and the nursing notes.                              Differential diagnosis includes, but is not limited to, ACS, aortic dissection, pulmonary embolism, cardiac tamponade, pneumothorax, pneumonia, pericarditis, myocarditis, GI-related causes including esophagitis/gastritis, and musculoskeletal chest wall pain.    Patient presents to the ED with 2 hours of left-sided chest  pain that she describes as sharp in nature.  Patient presents without diaphoresis, nausea, vomiting, or shortness of breath.  She is evaluated for complaints in the ED with atypical chest pain protocol including a plain chest x-ray which was reviewed by me revealing no acute disease.  EKG was without signs of malignant arrhythmia or ST changes.  Labs are reassuring as it shows no anemia, electrolyte abnormality, and negative troponin x2.  Patient is stable at this time without ongoing complaints.  No interventions given in the ED.  Patient's diagnosis is consistent with atypical chest pain. Patient is to follow up with her PCP and cardiologist as needed or otherwise directed. Patient is given ED precautions to return to the ED for any worsening or new symptoms.   FINAL CLINICAL IMPRESSION(S) / ED DIAGNOSES   Final diagnoses:  Nonspecific chest pain     Rx / DC Orders   ED Discharge Orders     None        Note:  This document was prepared using Dragon voice recognition software and may include unintentional dictation errors.    Melvenia Needles, PA-C 02/12/21 1842    Harvest Dark, MD 02/12/21 2158

## 2021-06-04 IMAGING — CT CT ABD-PELV W/O CM
2 of 4 series · 16 of 46 positions shown, 18 images · non-contrast
Comparison: None.

CLINICAL DATA: Abdominal pain and pressure.

EXAM:
CT ABDOMEN AND PELVIS WITHOUT CONTRAST
TECHNIQUE: Multidetector CT imaging of the abdomen and pelvis was performed
following the standard protocol without IV contrast.

[Series 2: axial st · axial · 0.85mm/px · z∈[-544,-109]mm · 13 of 99 slices shown, 15 images]
[im 6/99  soft-tissue]
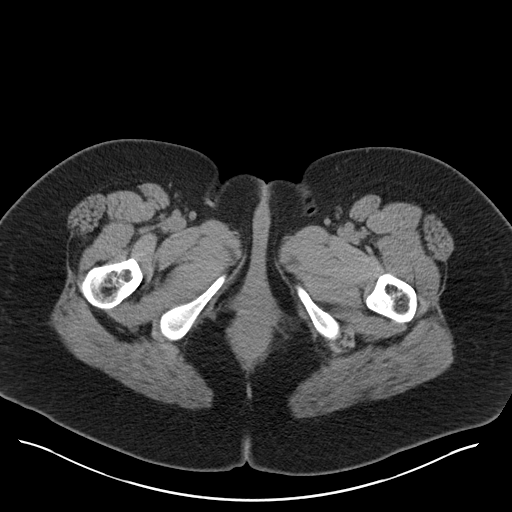
[im 6/99  bone]
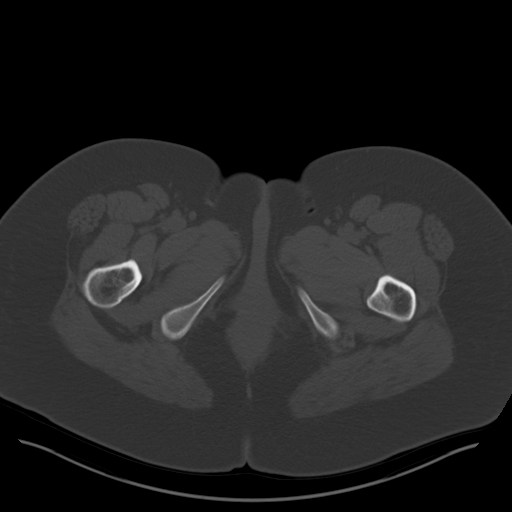
[im 11/99  soft-tissue]
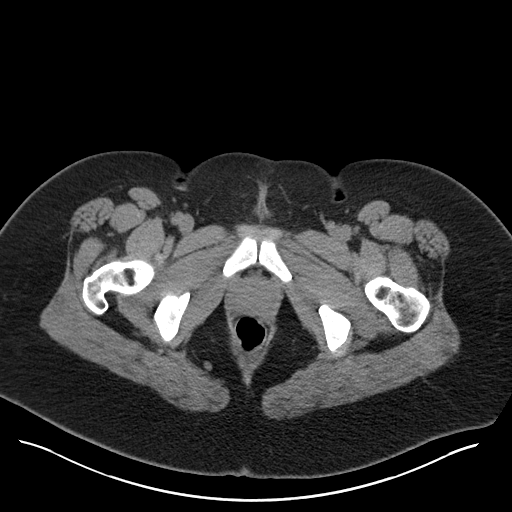
[im 22/99  soft-tissue]
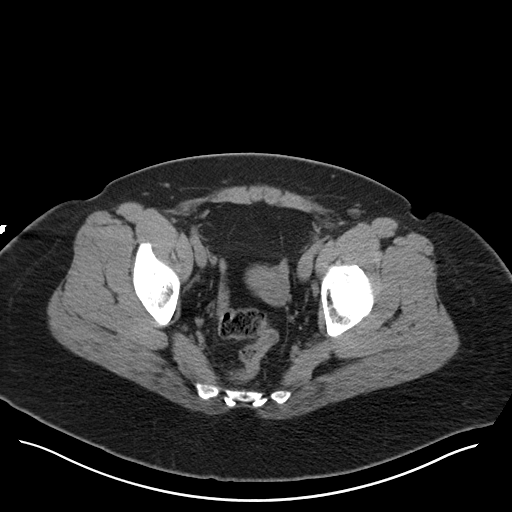
[im 28/99  soft-tissue]
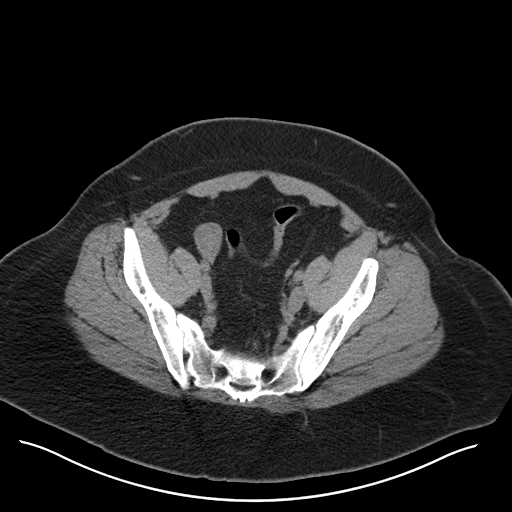
[im 33/99  soft-tissue]
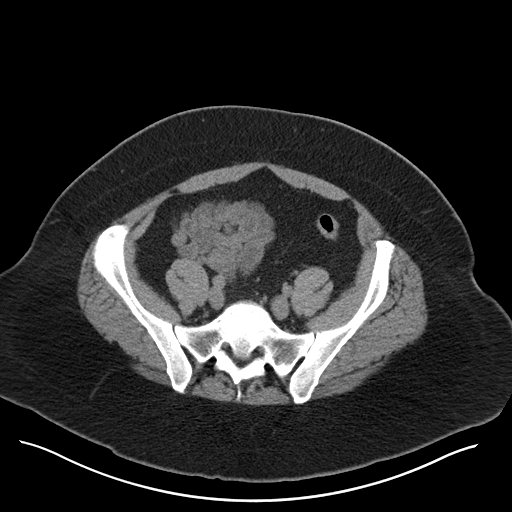
[im 44/99  soft-tissue]
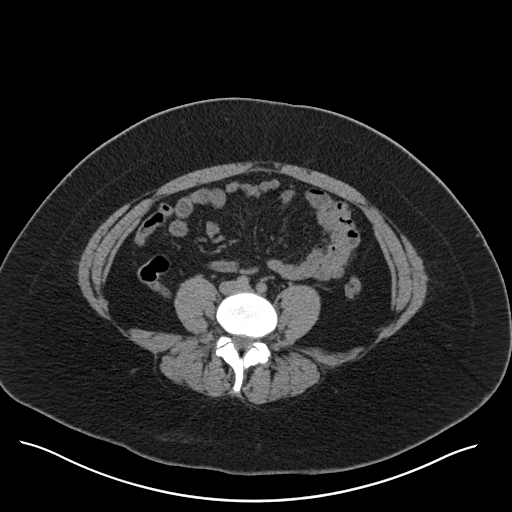
[im 50/99  soft-tissue]
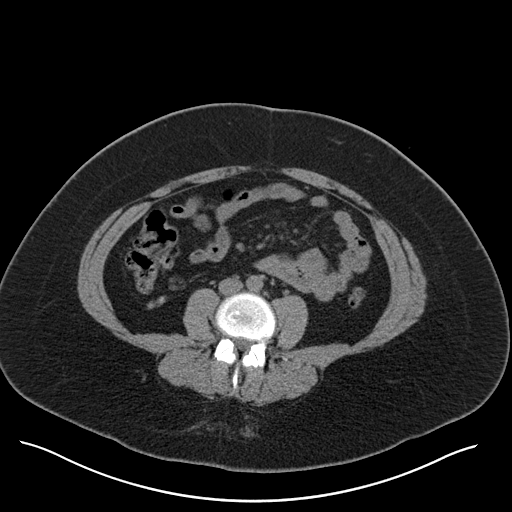
[im 55/99  soft-tissue]
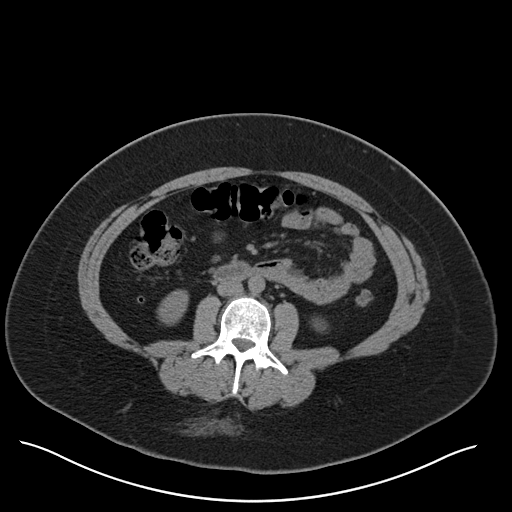
[im 66/99  soft-tissue]
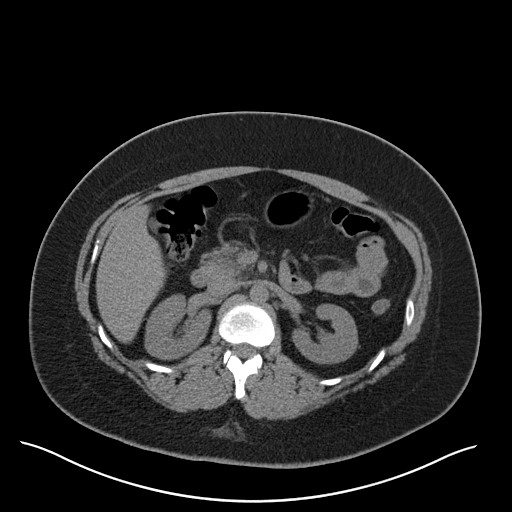
[im 66/99  bone]
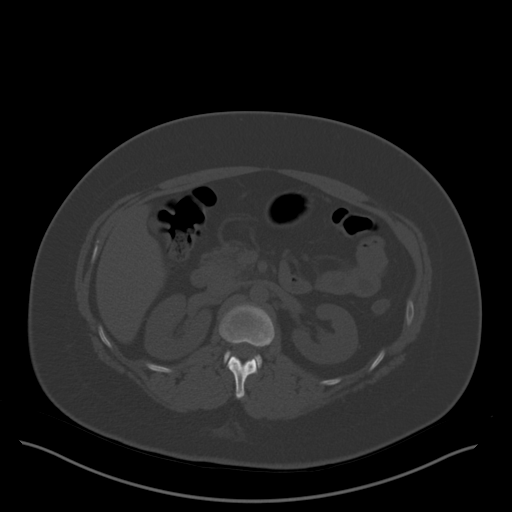
[im 71/99  soft-tissue]
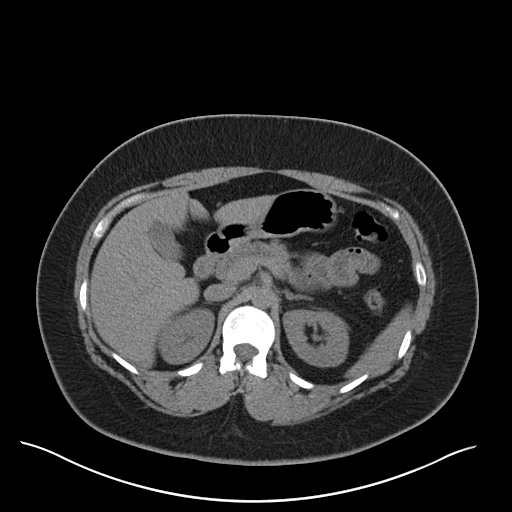
[im 77/99  soft-tissue]
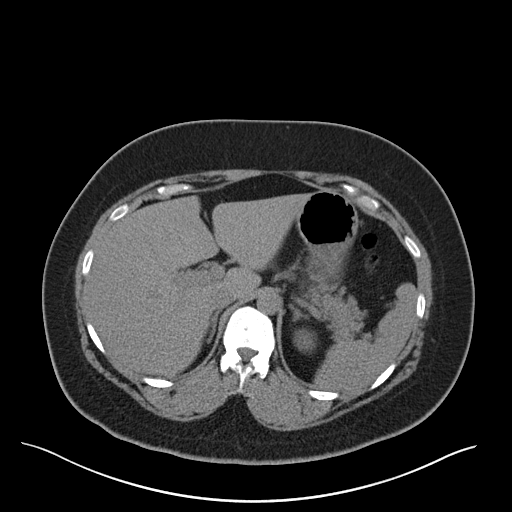
[im 88/99  soft-tissue]
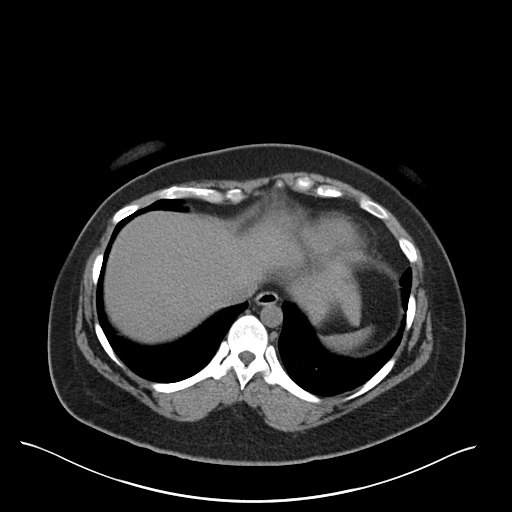
[im 93/99  soft-tissue]
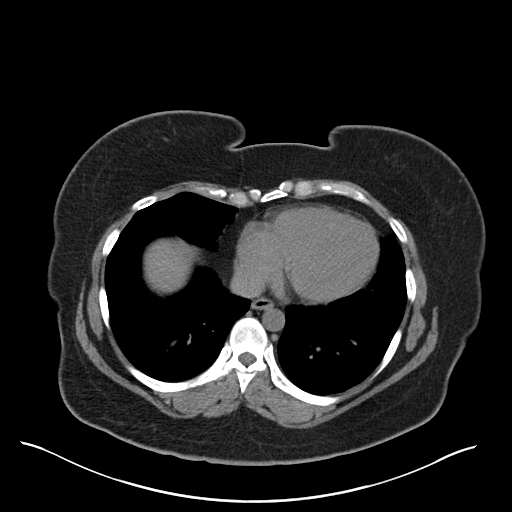

[Series 5: coronal st · coronal · 0.90mm/px · 3 of 175 slices shown]
[im 59/175  soft-tissue]
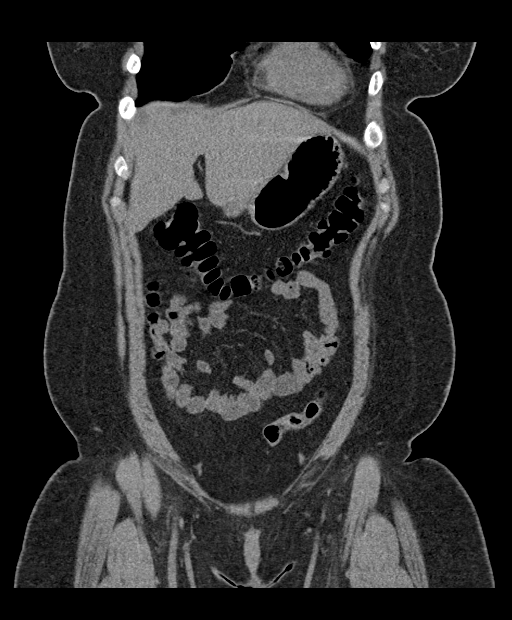
[im 78/175  soft-tissue]
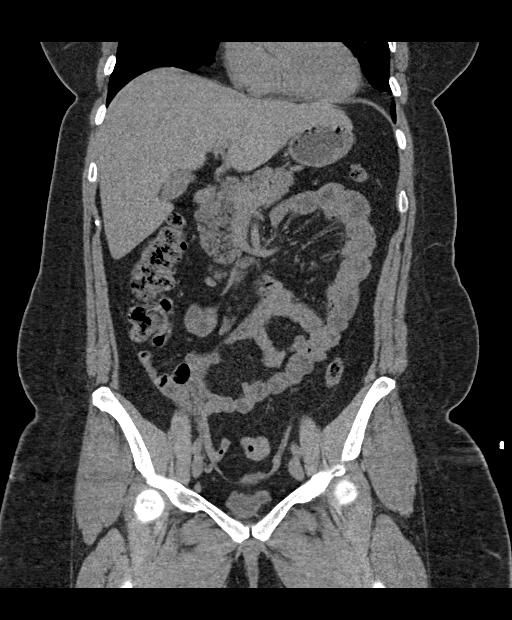
[im 97/175  soft-tissue]
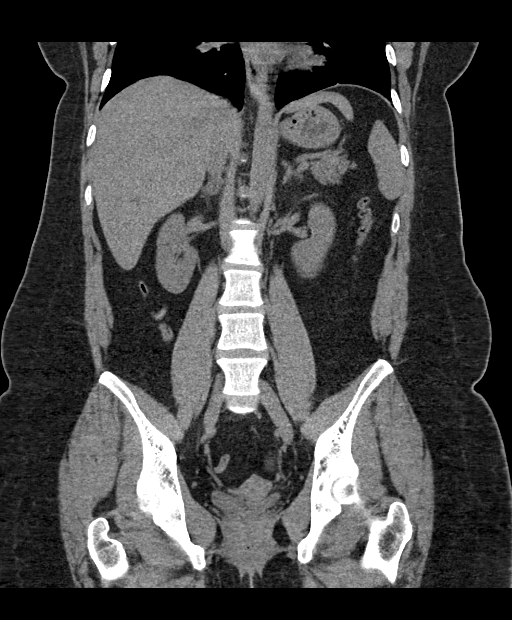

[16 of 46 positions shown; findings below may reference images not displayed]

FINDINGS: Lower chest: No basilar lung consolidation or pleural effusion.

Hepatobiliary: No focal liver abnormality is seen. No gallstones,
gallbladder wall thickening, or biliary dilatation.

Pancreas: Unremarkable.

Spleen: Unremarkable.

Adrenals/Urinary Tract: Unremarkable adrenal glands. No evidence of
renal mass, calculi, or hydronephrosis. Unremarkable bladder.

Stomach/Bowel: The stomach is unremarkable. There is no evidence of
bowel obstruction or inflammation. The appendix is unremarkable.

Vascular/Lymphatic: Normal caliber of the abdominal aorta. No
enlarged lymph nodes.

Reproductive: Uterus and bilateral adnexa are unremarkable.

Other: No intraperitoneal free fluid. Small amount of fat extending
into the umbilicus. No bowel containing hernia.

Musculoskeletal: No suspicious osseous lesion. Small Schmorl's nodes
in the lower thoracic spine.
IMPRESSION: No acute abnormality identified in the abdomen or pelvis.

## 2022-03-25 ENCOUNTER — Emergency Department
Admission: EM | Admit: 2022-03-25 | Discharge: 2022-03-25 | Disposition: A | Discharge status: Home / Self Care | Payer: BC Managed Care – PPO | Attending: Emergency Medicine | Admitting: Emergency Medicine

## 2022-03-25 ENCOUNTER — Other Ambulatory Visit: Payer: Self-pay

## 2022-03-25 DIAGNOSIS — S0990XA Unspecified injury of head, initial encounter: Secondary | ICD-10-CM

## 2022-03-25 DIAGNOSIS — W228XXA Striking against or struck by other objects, initial encounter: Secondary | ICD-10-CM | POA: Insufficient documentation

## 2022-03-25 DIAGNOSIS — S060X1A Concussion with loss of consciousness of 30 minutes or less, initial encounter: Secondary | ICD-10-CM | POA: Insufficient documentation

## 2022-03-25 DIAGNOSIS — Y990 Civilian activity done for income or pay: Secondary | ICD-10-CM | POA: Insufficient documentation

## 2022-03-25 MED ORDER — LIDOCAINE 5 % EX PTCH
1.0000 | MEDICATED_PATCH | CUTANEOUS | Status: DC
  Administered 2022-03-25: 1 via TRANSDERMAL
  Filled 2022-03-25: qty 1

## 2022-03-25 MED ORDER — IBUPROFEN 600 MG PO TABS
600.0000 mg | ORAL_TABLET | Freq: Once | ORAL | Status: AC
  Administered 2022-03-25: 600 mg via ORAL
  Filled 2022-03-25: qty 1

## 2022-03-25 MED ORDER — LIDOCAINE 5 % EX PTCH: 1.0000 | MEDICATED_PATCH | Freq: Two times a day (BID) | CUTANEOUS | 0 refills | Status: AC

## 2022-03-25 MED ORDER — ACETAMINOPHEN 325 MG PO TABS
650.0000 mg | ORAL_TABLET | Freq: Once | ORAL | Status: AC
  Administered 2022-03-25: 650 mg via ORAL
  Filled 2022-03-25: qty 2

## 2022-03-25 NOTE — ED Triage Notes (Signed)
Pt sts that she hit the top of her head at work. Pt sts that she is having neck pain and spinal pain.

## 2022-03-25 NOTE — ED Provider Notes (Signed)
[facility] Provider Note    Event Date/Time   First MD Initiated Contact with Patient 03/25/22 1520     (approximate)   History   Head Injury   HPI  Kathy Velasquez is a 27 y.o. female with a past medical history of generalized anxiety disorder, obesity who presents today for evaluation after head injury.  Patient reports that she was squatting down and stood up and struck her head on a hard object at work.  She denies loss of consciousness.  She denies fall to the ground.  She reports that she has pain in her head, neck, and low back.  She has not had any paresthesias or weakness in her extremities.  She had no episodes of vomiting.  She does not take any anticoagulation.  She reports that she has a mild headache in the location in which she struck her head only.  No open wounds.  Patient Active Problem List   Diagnosis Date Noted   Chest pain 07/13/2020   Generalized anxiety disorder 07/13/2020   GERD without esophagitis 07/13/2020   Nicotine dependence, other tobacco product, uncomplicated 0000000   PCOS (polycystic ovarian syndrome) 07/09/2020   Anxiety [id number]   Umbilical hernia [id number]          Physical Exam   Triage Vital Signs: ED Triage Vitals  Enc Vitals Group     BP 03/25/22 1505 (!) 153/108     Pulse Rate 03/25/22 1505 95     Resp 03/25/22 1505 17     Temp 03/25/22 1505 98 F (36.7 C)     Temp Source 03/25/22 1505 Oral     SpO2 03/25/22 1505 99 %     Weight 03/25/22 1505 257 lb (116.6 kg)     Height 03/25/22 1520 '5\' 5"'$  (1.651 m)     Head Circumference --      Peak Flow --      Pain Score 03/25/22 1505 5     Pain Loc --      Pain Edu? --      Excl. in [place]? --     Most recent vital signs: Vitals:   03/25/22 1505  BP: (!) 153/108  Pulse: 95  Resp: 17  Temp: 98 F (36.7 C)  SpO2: 99%    Physical Exam Vitals and nursing note reviewed.  Constitutional:      General: Awake and alert. No acute distress.     Appearance: Normal appearance. The patient is obese.  HENT:     Head: Normocephalic and atraumatic.     Mouth: Mucous membranes are moist.  Eyes:     General: PERRL. Normal EOMs        Right eye: No discharge.        Left eye: No discharge.     Conjunctiva/sclera: Conjunctivae normal.  Cardiovascular:     Rate and Rhythm: Normal rate and regular rhythm.     Pulses: Normal pulses.  Pulmonary:     Effort: Pulmonary effort is normal. No respiratory distress.     Breath sounds: Normal breath sounds.  Abdominal:     Abdomen is soft. There is no abdominal tenderness. No rebound or guarding. No distention. Musculoskeletal:        General: No swelling. Normal range of motion.     Cervical back: Normal range of motion and neck supple.  No midline cervical spine tenderness.  Full range of motion of neck.  Negative Spurling test.  Negative Lhermitte sign.  Normal strength and sensation in bilateral upper extremities. Normal grip strength bilaterally.  Normal intrinsic muscle function of the hand bilaterally.  Normal radial pulses bilaterally. Back: No midline tenderness. Strength and sensation 5/5 to bilateral lower extremities. Normal great toe extension against resistance. Normal sensation throughout feet. Normal patellar reflexes. Negative SLR and opposite SLR bilaterally. Negative FABER test Skin:    General: Skin is warm and dry.     Capillary Refill: Capillary refill takes less than 2 seconds.     Findings: No rash.  Neurological:     Mental Status: The patient is awake and alert.  Neurological: GCS 15 alert and oriented x3 Normal speech, no expressive or receptive aphasia or dysarthria Cranial nerves II through XII intact Normal visual fields 5 out of 5 strength in all 4 extremities with intact sensation throughout No extremity drift Normal finger-to-nose testing, no limb or truncal ataxia      ED Results / Procedures / Treatments   Labs (all labs ordered are listed, but only  abnormal results are displayed) Labs Reviewed - No data to display   EKG     RADIOLOGY     PROCEDURES:  Critical Care performed:   Procedures   MEDICATIONS ORDERED IN ED: Medications  lidocaine (LIDODERM) 5 % 1 patch (1 patch Transdermal Patch Applied 03/25/22 1630)  acetaminophen (TYLENOL) tablet 650 mg (650 mg Oral Given 03/25/22 1630)  ibuprofen (ADVIL) tablet 600 mg (600 mg Oral Given 03/25/22 1630)     IMPRESSION / MDM / ASSESSMENT AND PLAN / ED COURSE  I reviewed the triage vital signs and the nursing notes.   Differential diagnosis includes, but is not limited to, contusion, concussion, cervical spine injury, spasm, strain, less likely vertebral fracture.  Patient presents to the emergency department after  head strike. No neurological deficits or midline spinal tenderness. Not encephalopathic, overall well-appearing. Physical examination and imaging reassuring against urgent or emergent traumatic process. Does not require any other imaging or intervention at this time per [place] and Nexus criteria.  We did however discuss the option of advanced imaging, and patient has declined.  I suspect that she has a mild concussion.  We discussed concussion precautions.  She was given a work note and we discussed the importance of brain rest.  She declined analgesia with the exception of a Lidoderm patch which was prescribed for her.  Discussed care plan, return precautions, and advised close outpatient follow-up. Patient agrees with plan of care.  She was discharged in stable condition.   Patient's presentation is most consistent with acute complicated illness / injury requiring diagnostic workup.   FINAL CLINICAL IMPRESSION(S) / ED DIAGNOSES   Final diagnoses:  Concussion with loss of consciousness of 30 minutes or less, initial encounter  Closed head injury, initial encounter     Rx / DC Orders   ED Discharge Orders          Ordered    lidocaine (LIDODERM) 5 %   Every 12 hours        03/25/22 1621             Note:  This document was prepared using Dragon voice recognition software and may include unintentional dictation errors.   Emeline Gins 03/25/22 1643    Vanessa McNary, MD 03/25/22 [phone]

## 2022-03-25 NOTE — ED Notes (Signed)
See triage note  Presents with head injury  States she hit the top of head at work  Having pain to neck and lower back   States this happened at work  I spoke with Mateo Flow ,Librarian, academic  States she did not need UDS

## 2022-03-25 NOTE — Discharge Instructions (Addendum)
You may continue to take Tylenol/ibuprofen per package instructions to help with your symptoms.  Remember to practice concussion precautions as we discussed.  Please return for any new, worsening, or change in symptoms or other concerns.  It was a pleasure caring for you today.

## 2022-05-19 ENCOUNTER — Other Ambulatory Visit: Payer: Self-pay

## 2022-05-19 ENCOUNTER — Emergency Department
Admission: EM | Admit: 2022-05-19 | Discharge: 2022-05-19 | Disposition: A | Discharge status: Home / Self Care | Payer: No Typology Code available for payment source | Attending: Emergency Medicine | Admitting: Emergency Medicine

## 2022-05-19 ENCOUNTER — Encounter: Payer: Self-pay | Admitting: Emergency Medicine

## 2022-05-19 DIAGNOSIS — W25XXXA Contact with sharp glass, initial encounter: Secondary | ICD-10-CM | POA: Insufficient documentation

## 2022-05-19 DIAGNOSIS — Z7721 Contact with and (suspected) exposure to potentially hazardous body fluids: Secondary | ICD-10-CM | POA: Insufficient documentation

## 2022-05-19 DIAGNOSIS — F17290 Nicotine dependence, other tobacco product, uncomplicated: Secondary | ICD-10-CM | POA: Insufficient documentation

## 2022-05-19 DIAGNOSIS — S61235A Puncture wound without foreign body of left ring finger without damage to nail, initial encounter: Secondary | ICD-10-CM | POA: Insufficient documentation

## 2022-05-19 DIAGNOSIS — Y990 Civilian activity done for income or pay: Secondary | ICD-10-CM | POA: Insufficient documentation

## 2022-05-19 LAB — COMPREHENSIVE METABOLIC PANEL
ALT: 38 U/L (ref 0–44)
AST: 23 U/L (ref 15–41)
Albumin: 3.9 g/dL (ref 3.5–5.0)
Alkaline Phosphatase: 78 U/L (ref 38–126)
Anion gap: 8 (ref 5–15)
BUN: 20 mg/dL (ref 6–20)
CO2: 24 mmol/L (ref 22–32)
Calcium: 8.9 mg/dL (ref 8.9–10.3)
Chloride: 100 mmol/L (ref 98–111)
Creatinine, Ser: 0.72 mg/dL (ref 0.44–1.00)
GFR, Estimated: >60 mL/min (ref >60)
Glucose, Bld: 98 mg/dL (ref 70–99)
Potassium: 3.8 mmol/L (ref 3.5–5.1)
Sodium: 132 mmol/L — ABNORMAL LOW (ref 135–145)
Total Bilirubin: 0.6 mg/dL (ref 0.3–1.2)
Total Protein: 7.7 g/dL (ref 6.5–8.1)

## 2022-05-19 LAB — RAPID HIV SCREEN (HIV 1/2 AB+AG)
HIV 1/2 Antibodies: NONREACTIVE
HIV-1 P24 Antigen - HIV24: NONREACTIVE

## 2022-05-19 LAB — HEPATITIS B SURFACE ANTIGEN: Hepatitis B Surface Ag: NONREACTIVE

## 2022-05-19 LAB — POC URINE PREG, ED: Preg Test, Ur: NEGATIVE

## 2022-05-19 LAB — HEPATITIS C ANTIBODY: HCV Ab: NONREACTIVE

## 2022-05-19 MED ORDER — DOLUTEGRAVIR SODIUM 50 MG PO TABS
50.0000 mg | ORAL_TABLET | Freq: Every day | ORAL | Status: DC
  Administered 2022-05-19: 50 mg via ORAL
  Filled 2022-05-19: qty 1

## 2022-05-19 MED ORDER — DESCOVY 200-25 MG PO TABS: 1.0000 | ORAL_TABLET | Freq: Every day | ORAL | 0 refills | Status: AC

## 2022-05-19 MED ORDER — DOLUTEGRAVIR SODIUM 50 MG PO TABS: 50.0000 mg | ORAL_TABLET | Freq: Every day | ORAL | 0 refills | Status: AC

## 2022-05-19 MED ORDER — EMTRICITABINE-TENOFOVIR AF 200-25 MG PO TABS
1.0000 | ORAL_TABLET | Freq: Every day | ORAL | Status: DC
  Administered 2022-05-19: 1 via ORAL
  Filled 2022-05-19: qty 1

## 2022-05-19 NOTE — Discharge Instructions (Signed)
Please start taking the 2 HIV medications once per day for the next 28 days.  You will need to have repeat HIV testing 6 weeks, 3 months and 6 months postexposure.  Please follow-up with your primary care doctor.

## 2022-05-19 NOTE — ED Provider Notes (Signed)
[facility] Provider Note    Event Date/Time   First MD Initiated Contact with Patient 05/19/22 [phone]     (approximate)   History   Laceration and Body Fluid Exposure   HPI  Ellaina A Billard is a 27 y.o. female no significant past medical history presents with a occupational exposure.  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 and a piece of glass from the sharps container landed on her finger and penetrated her glove.  This was the left ring finger.  There was bleeding from a small wound which he thoroughly cleaned.  This was about 330 yesterday.  She presents today because of concern for occupational exposure.  She think she is up-to-date on tetanus and does not want tetanus shot today.     Past Medical History:  Diagnosis Date   Anxiety    Depression    Hernia of abdominal cavity    PCOS (polycystic ovarian syndrome)    PTSD (post-traumatic stress disorder)    Umbilical hernia 07/01/2020    Patient Active Problem List   Diagnosis Date Noted   Chest pain 07/13/2020   Generalized anxiety disorder 07/13/2020   GERD without esophagitis 07/13/2020   Nicotine dependence, other tobacco product, uncomplicated 07/13/2020   PCOS (polycystic ovarian syndrome) 07/09/2020   Anxiety 07/09/2020   Umbilical hernia 07/01/2020     Physical Exam  Triage Vital Signs: ED Triage Vitals  Enc Vitals Group     BP 05/19/22 0823 120/84     Pulse Rate 05/19/22 0823 85     Resp 05/19/22 0823 16     Temp --      Temp src --      SpO2 05/19/22 0823 96 %     Weight 05/19/22 0820 260 lb (117.9 kg)     Height 05/19/22 0819 5' 5.25" (1.657 m)     Head Circumference --      Peak Flow --      Pain Score 05/19/22 0819 0     Pain Loc --      Pain Edu? --      Excl. in [place]? --     Most recent vital signs: Vitals:   05/19/22 0823  BP: 120/84  Pulse: 85  Resp: 16  SpO2: 96%     General: Awake,  no distress.  CV:  Good peripheral perfusion.  Resp:  Normal effort.  Abd:  No distention.  Neuro:             Awake, Alert, Oriented x 3  Other:  Small puncture wound on the left ring finger no active bleeding no erythema or signs of infection   ED Results / Procedures / Treatments  Labs (all labs ordered are listed, but only abnormal results are displayed) Labs Reviewed  COMPREHENSIVE METABOLIC PANEL - Abnormal; Notable for the following components:      Result Value   Sodium 132 (*)    All other components within normal limits  RAPID HIV SCREEN (HIV 1/2 AB+AG)  HEPATITIS C ANTIBODY  HEPATITIS B SURFACE ANTIGEN  POC URINE PREG, ED     EKG     RADIOLOGY    PROCEDURES:  Critical Care performed: No  Procedures   MEDICATIONS ORDERED IN ED: Medications  dolutegravir (TIVICAY) tablet 50 mg (has no administration in time range)  emtricitabine-tenofovir AF (DESCOVY) 200-25 MG per tablet 1 tablet (has no administration in time  range)     IMPRESSION / MDM / ASSESSMENT AND PLAN / ED COURSE  I reviewed the triage vital signs and the nursing notes.                              Patient is a 27 year old female who presents after an occupational exposure.  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  This did penetrate her glove and caused a small puncture wound that did not bleed.  She irrigated the wound.  This happened yesterday less than 24 hours ago.  She cannot be aware of the patient's HIV hep C or hep B status and apparently there were multiple samples she could have been exposed to as it was from the sharps container.  Overall low risk for HIV transmission is likely fairly low but is not 0 and I did have a risk-benefit discussion with the patient about PAP and she ultimately did want to go forward with PAP.  I  have sent the baseline HIV hep B and hep C labs as well as a CMP.  Will plan to start dolutegravir and emtricitabine tenofovir.  FINAL CLINICAL IMPRESSION(S) / ED DIAGNOSES   Final diagnoses:  Exposure to body fluid     Rx / DC Orders   ED Discharge Orders          Ordered    dolutegravir (TIVICAY) 50 MG tablet  Daily        05/19/22 0934    emtricitabine-tenofovir AF (DESCOVY) 200-25 MG tablet  Daily        05/19/22 0934             Note:  This document was prepared using Dragon voice recognition software and may include unintentional dictation errors.   Georga Hacking, MD 05/19/22 [phone]

## 2022-05-19 NOTE — ED Triage Notes (Signed)
Pt via POV from home. Pt here for laceration to L ring finger. Pt states she cut it on contaminated glass. 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 Pt is A&Ox4 and NAD
